# Patient Record
Sex: Female | Born: 1945 | Race: White | Hispanic: No | Marital: Married | Smoking: Never smoker
Health system: Southern US, Community
[De-identification: ages and names within clinical notes are randomized; demographics above are authoritative.]

## PROBLEM LIST (undated history)

## (undated) DIAGNOSIS — E119 Type 2 diabetes mellitus without complications: Secondary | ICD-10-CM

## (undated) DIAGNOSIS — C801 Malignant (primary) neoplasm, unspecified: Secondary | ICD-10-CM

## (undated) DIAGNOSIS — I2510 Atherosclerotic heart disease of native coronary artery without angina pectoris: Secondary | ICD-10-CM

## (undated) DIAGNOSIS — Z9889 Other specified postprocedural states: Secondary | ICD-10-CM

## (undated) DIAGNOSIS — M1990 Unspecified osteoarthritis, unspecified site: Secondary | ICD-10-CM

## (undated) DIAGNOSIS — R112 Nausea with vomiting, unspecified: Secondary | ICD-10-CM

## (undated) DIAGNOSIS — Z951 Presence of aortocoronary bypass graft: Secondary | ICD-10-CM

## (undated) DIAGNOSIS — K909 Intestinal malabsorption, unspecified: Secondary | ICD-10-CM

## (undated) DIAGNOSIS — Z9289 Personal history of other medical treatment: Secondary | ICD-10-CM

## (undated) DIAGNOSIS — K219 Gastro-esophageal reflux disease without esophagitis: Secondary | ICD-10-CM

## (undated) DIAGNOSIS — I10 Essential (primary) hypertension: Secondary | ICD-10-CM

## (undated) DIAGNOSIS — D500 Iron deficiency anemia secondary to blood loss (chronic): Secondary | ICD-10-CM

## (undated) DIAGNOSIS — R011 Cardiac murmur, unspecified: Secondary | ICD-10-CM

## (undated) DIAGNOSIS — Z87442 Personal history of urinary calculi: Secondary | ICD-10-CM

## (undated) DIAGNOSIS — E785 Hyperlipidemia, unspecified: Secondary | ICD-10-CM

## (undated) HISTORY — PX: COLONOSCOPY: SHX174

## (undated) HISTORY — DX: Presence of aortocoronary bypass graft: Z95.1

## (undated) HISTORY — PX: BACK SURGERY: SHX140

## (undated) HISTORY — PX: CHOLECYSTECTOMY: SHX55

## (undated) HISTORY — DX: Intestinal malabsorption, unspecified: K90.9

## (undated) HISTORY — PX: ABDOMINAL HYSTERECTOMY: SHX81

## (undated) HISTORY — DX: Type 2 diabetes mellitus without complications: E11.9

## (undated) HISTORY — DX: Essential (primary) hypertension: I10

## (undated) HISTORY — PX: APPENDECTOMY: SHX54

## (undated) HISTORY — DX: Iron deficiency anemia secondary to blood loss (chronic): D50.0

## (undated) HISTORY — DX: Other specified postprocedural states: Z98.890

## (undated) HISTORY — DX: Hyperlipidemia, unspecified: E78.5

---

## 2015-12-01 LAB — HM MAMMOGRAPHY

## 2016-01-13 ENCOUNTER — Encounter: Payer: Self-pay | Admitting: Physician Assistant

## 2016-01-13 ENCOUNTER — Ambulatory Visit (INDEPENDENT_AMBULATORY_CARE_PROVIDER_SITE_OTHER): Payer: Medicare Other | Admitting: Physician Assistant

## 2016-01-13 VITALS — BP 174/63 | HR 79 | Ht 64.0 in | Wt 150.0 lb

## 2016-01-13 DIAGNOSIS — E118 Type 2 diabetes mellitus with unspecified complications: Secondary | ICD-10-CM | POA: Diagnosis not present

## 2016-01-13 DIAGNOSIS — R252 Cramp and spasm: Secondary | ICD-10-CM | POA: Diagnosis not present

## 2016-01-13 DIAGNOSIS — Z794 Long term (current) use of insulin: Secondary | ICD-10-CM

## 2016-01-13 DIAGNOSIS — E785 Hyperlipidemia, unspecified: Secondary | ICD-10-CM | POA: Diagnosis not present

## 2016-01-13 DIAGNOSIS — M158 Other polyosteoarthritis: Secondary | ICD-10-CM | POA: Diagnosis not present

## 2016-01-13 LAB — POCT GLYCOSYLATED HEMOGLOBIN (HGB A1C): Hemoglobin A1C: 12.9

## 2016-01-13 MED ORDER — CANAGLIFLOZIN 300 MG PO TABS: 300.0000 mg | ORAL_TABLET | Freq: Every day | ORAL | Status: DC

## 2016-01-13 MED ORDER — CELECOXIB 200 MG PO CAPS: 200.0000 mg | ORAL_CAPSULE | Freq: Two times a day (BID) | ORAL | Status: DC

## 2016-01-13 NOTE — Patient Instructions (Addendum)
Alka seltzer tab with water for muscle cramps.  Tumeric 500mg  twice a day.

## 2016-01-14 DIAGNOSIS — E118 Type 2 diabetes mellitus with unspecified complications: Secondary | ICD-10-CM | POA: Insufficient documentation

## 2016-01-14 DIAGNOSIS — R252 Cramp and spasm: Secondary | ICD-10-CM | POA: Insufficient documentation

## 2016-01-14 DIAGNOSIS — Z794 Long term (current) use of insulin: Secondary | ICD-10-CM | POA: Insufficient documentation

## 2016-01-14 DIAGNOSIS — E782 Mixed hyperlipidemia: Secondary | ICD-10-CM | POA: Insufficient documentation

## 2016-01-14 DIAGNOSIS — M158 Other polyosteoarthritis: Secondary | ICD-10-CM | POA: Insufficient documentation

## 2016-01-14 NOTE — Progress Notes (Signed)
   Subjective:    Patient ID: [name], female    DOB: 04-01-1946, 70 y.o.   MRN: [id number]  HPI Pt is a 70 yo female who presents to the clinic to establish care.   .. Active Ambulatory Problems    Diagnosis Date Noted  . Type 2 diabetes mellitus with complication, with long-term current use of insulin ([place]) 01/14/2016  . Hyperlipidemia 01/14/2016  . Muscle cramps 01/14/2016  . Other osteoarthritis involving multiple joints 01/14/2016   Resolved Ambulatory Problems    Diagnosis Date Noted  . No Resolved Ambulatory Problems   Past Medical History  Diagnosis Date  . Hypertension   . Diabetes mellitus without complication ([place])    .Marland Kitchen Family History  Problem Relation Age of Onset  . Heart attack Mother   . Diabetes Mother   . Heart attack Father   . Diabetes Father    .Marland Kitchen Social History   Social History  . Marital Status: Married    Spouse Name: N/A  . Number of Children: N/A  . Years of Education: N/A   Occupational History  . Not on file.   Social History Main Topics  . Smoking status: Never Smoker   . Smokeless tobacco: Not on file  . Alcohol Use: No  . Drug Use: No  . Sexual Activity: No   Other Topics Concern  . Not on file   Social History Narrative  . No narrative on file   Pt has uncontrolled DM. Per patient has type II. She is taking over 300 units of fast acting insulin a day and 400 units of long acting inuslin a day. Her average sugars are 300 and she brings in log. Denies any glycemic events. Denies any open wounds or sores. No overt vision changes or neuropathy.   She does have occasional muscle cramps. Nothing being done about it.   She has multiple joint pain. No known trauma. Worse in hands and knees and back. Not taking anything for it.   Review of Systems    see HPI.  Objective:   Physical Exam  Constitutional: She is oriented to person, place, and time. She appears well-developed and well-nourished.  HENT:  Head: Normocephalic  and atraumatic.  Cardiovascular: Normal rate, regular rhythm and normal heart sounds.   Pulmonary/Chest: Effort normal and breath sounds normal.  Neurological: She is alert and oriented to person, place, and time.  Skin: Skin is dry.  Psychiatric: She has a normal mood and affect. Her behavior is normal.          Assessment & Plan:  Type II diabetes mellitus- .Marland Kitchen Results for orders placed or performed in visit on 01/13/16  POCT HgB A1C  Result Value Ref Range   Hemoglobin A1C 12.9    Unclear what is going on here.  Very high doses of insulin that are not bringing down glucose.  Will consult endocrinology.  Needs eye exam.  Will start invokana and see if will lower glucose.  Concerned about her ability to absorb the insulin.  Follow up potentially sooner but no longer than 3 months.   Muscle cramps- certainly could be due to DM and potassium/sodium pump changes. Will check potassium, sodium, magnesium today.  Discussed alka seltzer at onset of cramps.   OA multiple joints- started celebrex. Discussed working on specific joints. Follow up for this.   Hyperlipidemia- taking lipitor. Will recheck with lipid panel in the future.

## 2016-01-15 ENCOUNTER — Encounter: Payer: Self-pay | Admitting: Physician Assistant

## 2016-01-15 ENCOUNTER — Other Ambulatory Visit: Payer: Self-pay | Admitting: Physician Assistant

## 2016-01-15 DIAGNOSIS — E118 Type 2 diabetes mellitus with unspecified complications: Secondary | ICD-10-CM

## 2016-01-15 DIAGNOSIS — E781 Pure hyperglyceridemia: Secondary | ICD-10-CM | POA: Insufficient documentation

## 2016-01-15 DIAGNOSIS — Z794 Long term (current) use of insulin: Secondary | ICD-10-CM

## 2016-01-15 DIAGNOSIS — N183 Chronic kidney disease, stage 3 unspecified: Secondary | ICD-10-CM | POA: Insufficient documentation

## 2016-01-15 DIAGNOSIS — R7309 Other abnormal glucose: Secondary | ICD-10-CM

## 2016-01-15 LAB — LIPID PANEL
CHOL/HDL RATIO: 3.4 ratio (ref <=5.0)
CHOLESTEROL: 156 mg/dL (ref 125–200)
HDL: 46 mg/dL (ref >=46)
LDL Cholesterol: 38 mg/dL (ref <130)
TRIGLYCERIDES: 360 mg/dL — AB (ref <150)
VLDL: 72 mg/dL — ABNORMAL HIGH (ref <30)

## 2016-01-15 LAB — MAGNESIUM: Magnesium: 1.7 mg/dL (ref 1.5–2.5)

## 2016-01-15 LAB — COMPLETE METABOLIC PANEL WITH GFR
ALBUMIN: 4.2 g/dL (ref 3.6–5.1)
ALT: 15 U/L (ref 6–29)
AST: 15 U/L (ref 10–35)
Alkaline Phosphatase: 124 U/L (ref 33–130)
BILIRUBIN TOTAL: 0.4 mg/dL (ref 0.2–1.2)
BUN: 28 mg/dL — ABNORMAL HIGH (ref 7–25)
CALCIUM: 9.4 mg/dL (ref 8.6–10.4)
CO2: 21 mmol/L (ref 20–31)
Chloride: 101 mmol/L (ref 98–110)
Creat: 1.31 mg/dL — ABNORMAL HIGH (ref 0.60–0.93)
GFR, EST AFRICAN AMERICAN: 47 mL/min — AB (ref >=60)
GFR, EST NON AFRICAN AMERICAN: 41 mL/min — AB (ref >=60)
Glucose, Bld: 275 mg/dL — ABNORMAL HIGH (ref 65–99)
Potassium: 4.9 mmol/L (ref 3.5–5.3)
Sodium: 135 mmol/L (ref 135–146)
Total Protein: 6.7 g/dL (ref 6.1–8.1)

## 2016-01-15 LAB — LIPASE: LIPASE: 44 U/L (ref 7–60)

## 2016-01-15 NOTE — Progress Notes (Signed)
Please call patient. I would like for her to see dr. Hartford Poli here in [place]. He is very smart. I consulted with a doctor in office and felt this was the best option.

## 2016-01-15 NOTE — Addendum Note (Signed)
Addended by: Donella Stade on: 01/15/2016 01:45 PM   Modules accepted: Orders

## 2016-01-16 NOTE — Progress Notes (Signed)
Pt.notified

## 2016-01-19 ENCOUNTER — Other Ambulatory Visit: Payer: Self-pay | Admitting: *Deleted

## 2016-01-19 ENCOUNTER — Telehealth: Payer: Self-pay | Admitting: *Deleted

## 2016-01-19 DIAGNOSIS — Z794 Long term (current) use of insulin: Secondary | ICD-10-CM

## 2016-01-19 DIAGNOSIS — R932 Abnormal findings on diagnostic imaging of liver and biliary tract: Secondary | ICD-10-CM

## 2016-01-19 DIAGNOSIS — E118 Type 2 diabetes mellitus with unspecified complications: Secondary | ICD-10-CM

## 2016-01-19 MED ORDER — CANAGLIFLOZIN 100 MG PO TABS: 100.0000 mg | ORAL_TABLET | Freq: Every day | ORAL | Status: DC

## 2016-01-19 MED ORDER — ICOSAPENT ETHYL 1 G PO CAPS: 2.0000 g | ORAL_CAPSULE | Freq: Two times a day (BID) | ORAL | Status: DC

## 2016-01-19 NOTE — Telephone Encounter (Signed)
CT reordered to WITH IV contrast & sent to [facility].

## 2016-01-20 ENCOUNTER — Other Ambulatory Visit: Payer: Medicare Other

## 2016-01-22 ENCOUNTER — Telehealth: Payer: Self-pay | Admitting: *Deleted

## 2016-01-22 ENCOUNTER — Ambulatory Visit (HOSPITAL_BASED_OUTPATIENT_CLINIC_OR_DEPARTMENT_OTHER)
Admission: RE | Admit: 2016-01-22 | Discharge: 2016-01-22 | Disposition: A | Discharge status: Home / Self Care | Payer: Medicare Other | Source: Ambulatory Visit | Attending: Physician Assistant | Admitting: Physician Assistant

## 2016-01-22 DIAGNOSIS — E118 Type 2 diabetes mellitus with unspecified complications: Secondary | ICD-10-CM | POA: Diagnosis present

## 2016-01-22 DIAGNOSIS — R1030 Lower abdominal pain, unspecified: Secondary | ICD-10-CM | POA: Diagnosis not present

## 2016-01-22 DIAGNOSIS — Z794 Long term (current) use of insulin: Secondary | ICD-10-CM | POA: Insufficient documentation

## 2016-01-22 DIAGNOSIS — I700 Atherosclerosis of aorta: Secondary | ICD-10-CM | POA: Diagnosis not present

## 2016-01-22 MED ORDER — IOPAMIDOL (ISOVUE-300) INJECTION 61%
100.0000 mL | Freq: Once | INTRAVENOUS | Status: AC | PRN
  Administered 2016-01-22: 80 mL via INTRAVENOUS

## 2016-01-22 NOTE — Telephone Encounter (Signed)
Pt left vm stating that she had her CT done today. She was advised not to take her metformin for 2 days.  Is this correct? Please advise.

## 2016-01-22 NOTE — Telephone Encounter (Signed)
PA initiated for Vascepa Key: [facility] - Rx #: [id number]

## 2016-01-22 NOTE — Telephone Encounter (Signed)
Yes due to constrast and metabolized through kidneys.

## 2016-01-23 NOTE — Telephone Encounter (Signed)
Pt.notified

## 2016-01-26 NOTE — Telephone Encounter (Signed)
Vascepa denied since triglyceride left is less than 500mg /dl. Denial letter placed in provider's box

## 2016-01-27 ENCOUNTER — Telehealth: Payer: Self-pay

## 2016-01-27 NOTE — Addendum Note (Signed)
Addended by: Donella Stade on: 01/27/2016 09:46 AM   Modules accepted: Orders

## 2016-01-27 NOTE — Telephone Encounter (Signed)
Pt notified of the results of abdominal CT.  She had received a call from [facility] and called to find out what it showed.

## 2016-01-27 NOTE — Telephone Encounter (Signed)
Will refer to GI.

## 2016-02-13 ENCOUNTER — Encounter: Payer: Self-pay | Admitting: Physician Assistant

## 2016-02-13 ENCOUNTER — Telehealth: Payer: Self-pay | Admitting: Physician Assistant

## 2016-02-13 ENCOUNTER — Ambulatory Visit (INDEPENDENT_AMBULATORY_CARE_PROVIDER_SITE_OTHER): Payer: Medicare Other | Admitting: Physician Assistant

## 2016-02-13 VITALS — BP 148/58 | HR 67 | Ht 64.0 in | Wt 149.0 lb

## 2016-02-13 DIAGNOSIS — E118 Type 2 diabetes mellitus with unspecified complications: Secondary | ICD-10-CM | POA: Diagnosis not present

## 2016-02-13 DIAGNOSIS — K769 Liver disease, unspecified: Secondary | ICD-10-CM | POA: Diagnosis not present

## 2016-02-13 DIAGNOSIS — N183 Chronic kidney disease, stage 3 unspecified: Secondary | ICD-10-CM

## 2016-02-13 DIAGNOSIS — M158 Other polyosteoarthritis: Secondary | ICD-10-CM

## 2016-02-13 DIAGNOSIS — Z794 Long term (current) use of insulin: Secondary | ICD-10-CM

## 2016-02-13 MED ORDER — AMLODIPINE BESYLATE 10 MG PO TABS: 10.0000 mg | ORAL_TABLET | Freq: Every day | ORAL | Status: DC

## 2016-02-13 NOTE — Telephone Encounter (Signed)
Please call [facility] [place] for copy of mammogram.

## 2016-02-13 NOTE — Progress Notes (Signed)
   Subjective:    Patient ID: [name], female    DOB: 07-Jan-1946, 70 y.o.   MRN: [id number]  HPI  Pt is a 70 yo female with uncontrolled type II diabetes who comes in for follow up .   Dr. Hartford Poli, endocrinologist, is following DM uncontrolled. Changed up meds and patient is to follow up later this month. She does not notice much lower sugars but continues to use rapid acting insulin and take metformin and actos.   She is also being evaluated by GI for chronic liver disease.   She has osteoarthritis and doing great on Celebrex daily. Since taking it she has felt great and had little to none joint pain.    Review of Systems  All other systems reviewed and are negative.      Objective:   Physical Exam  Constitutional: She is oriented to person, place, and time. She appears well-developed and well-nourished.  HENT:  Head: Normocephalic and atraumatic.  Cardiovascular: Normal rate, regular rhythm and normal heart sounds.   Pulmonary/Chest: Effort normal and breath sounds normal.  Neurological: She is alert and oriented to person, place, and time.  Psychiatric: She has a normal mood and affect. Her behavior is normal.          Assessment & Plan:  Osteoarthritis/CKD III- continue on celebrex for now. Drop down to once a day. Will recheck bmp to check kidney function. Discussed if increasing kidney function going to have to stop celebrex even though it is helping so much.   DM, uncontrolled type II- managed by Dr. Hartford Poli. Will find out which pneumonia vaccine she is missing.  HTN- need to increase norvasc to 10mg .    Chronic liver disease- managed by [facility]. Liver enzymes great. Screening labs found no cause for liver disease other than DM.   Follow up in 3 months.   Will call to get mammogram from the breast clinic in [place].

## 2016-02-13 NOTE — Telephone Encounter (Signed)
A request was faxed.

## 2016-02-27 LAB — BASIC METABOLIC PANEL WITH GFR
BUN: 19 mg/dL (ref 7–25)
CALCIUM: 9.5 mg/dL (ref 8.6–10.4)
CO2: 23 mmol/L (ref 20–31)
Chloride: 100 mmol/L (ref 98–110)
Creat: 1.12 mg/dL — ABNORMAL HIGH (ref 0.60–0.93)
GFR, EST AFRICAN AMERICAN: 57 mL/min — AB (ref >=60)
GFR, EST NON AFRICAN AMERICAN: 50 mL/min — AB (ref >=60)
GLUCOSE: 368 mg/dL — AB (ref 65–99)
POTASSIUM: 4.9 mmol/L (ref 3.5–5.3)
Sodium: 135 mmol/L (ref 135–146)

## 2016-03-04 LAB — HM DIABETES EYE EXAM

## 2016-03-18 ENCOUNTER — Encounter: Payer: Self-pay | Admitting: Physician Assistant

## 2016-03-29 ENCOUNTER — Other Ambulatory Visit: Payer: Self-pay | Admitting: Physician Assistant

## 2016-05-18 ENCOUNTER — Ambulatory Visit (INDEPENDENT_AMBULATORY_CARE_PROVIDER_SITE_OTHER): Payer: Medicare Other | Admitting: Physician Assistant

## 2016-05-18 ENCOUNTER — Encounter: Payer: Self-pay | Admitting: Physician Assistant

## 2016-05-18 VITALS — BP 121/60 | HR 64 | Ht 64.0 in | Wt 147.0 lb

## 2016-05-18 DIAGNOSIS — K5900 Constipation, unspecified: Secondary | ICD-10-CM | POA: Insufficient documentation

## 2016-05-18 DIAGNOSIS — Z79899 Other long term (current) drug therapy: Secondary | ICD-10-CM | POA: Diagnosis not present

## 2016-05-18 DIAGNOSIS — N183 Chronic kidney disease, stage 3 unspecified: Secondary | ICD-10-CM

## 2016-05-18 DIAGNOSIS — Z794 Long term (current) use of insulin: Secondary | ICD-10-CM

## 2016-05-18 DIAGNOSIS — M158 Other polyosteoarthritis: Secondary | ICD-10-CM

## 2016-05-18 DIAGNOSIS — E118 Type 2 diabetes mellitus with unspecified complications: Secondary | ICD-10-CM

## 2016-05-18 LAB — BASIC METABOLIC PANEL WITH GFR
BUN: 23 mg/dL (ref 7–25)
CO2: 21 mmol/L (ref 20–31)
CREATININE: 1.12 mg/dL — AB (ref 0.60–0.93)
Calcium: 9.5 mg/dL (ref 8.6–10.4)
Chloride: 108 mmol/L (ref 98–110)
GFR, EST AFRICAN AMERICAN: 57 mL/min — AB (ref >=60)
GFR, Est Non African American: 50 mL/min — ABNORMAL LOW (ref >=60)
GLUCOSE: 82 mg/dL (ref 65–99)
Potassium: 5.1 mmol/L (ref 3.5–5.3)
Sodium: 140 mmol/L (ref 135–146)

## 2016-05-18 NOTE — Patient Instructions (Signed)

## 2016-05-18 NOTE — Progress Notes (Addendum)
Subjective:     Patient ID: [name], female   DOB: 1945-12-29, 70 y.o.   MRN: [id number]  HPI Patient is a 70 y.o. Caucasian female presenting today for a routine follow-up for her diabetes and osteoarthritis. Patient reports that she is currently being monitored by endocrinology and was recently placed on an insulin pump. The patient states that she is feeling much better following this change to her medical regimen and has seen improvements in her activity level and appetite. The patient notes that she is still "leery" about utilizing the pump but that she is able to regularly contact the nurse at her endocrinologist whom is able to help educate her. The patient states that prior to being place on the pump her blood sugars were reaching 500-600 range. The patient notes that she is currently not eating as much and is unsure of the signs/symptoms of hypoglycemia. The patient notes that change was made to her Metformin but is unsure of the new medications name. Also, the patient notes that she was scheduled for a colonoscopy but was unable to have one due to her increased blood sugars. The patient states that she is also doing great on her celebrex although she does experience some break through pain. Lastly, the patient states that she is experiencing some constipation as she only has bowel movements every 2-3 days. She states that her stool is hard and that she has not taking anything for her symptoms but drinks a large amount of water every day. Patient states that she is trying to stay active but is having some problems due to her family issues.   Review of Systems  Constitutional: Positive for activity change and appetite change. Negative for chills, diaphoresis, fatigue, fever and unexpected weight change.  HENT: Negative for congestion, ear discharge, ear pain, facial swelling, postnasal drip, rhinorrhea, sinus pressure and sore throat.   Respiratory: Negative for apnea, cough, chest tightness,  shortness of breath and wheezing.   Cardiovascular: Negative for chest pain, palpitations and leg swelling.  Gastrointestinal: Positive for constipation. Negative for abdominal distention, abdominal pain, anal bleeding, blood in stool, diarrhea, nausea, rectal pain and vomiting.  Endocrine: Negative for cold intolerance, heat intolerance, polydipsia, polyphagia and polyuria.  Genitourinary: Negative for decreased urine volume, difficulty urinating, dysuria, enuresis, flank pain, frequency, hematuria, pelvic pain, urgency, vaginal bleeding, vaginal discharge and vaginal pain.  Musculoskeletal: Positive for arthralgias. Negative for back pain, myalgias, neck pain and neck stiffness.  Neurological: Negative for dizziness, tremors, syncope, weakness, light-headedness, numbness and headaches.  Psychiatric/Behavioral: Negative for confusion.      Objective:   Physical Exam  Constitutional: She is oriented to person, place, and time. She appears well-developed and well-nourished. No distress.  HENT:  Head: Normocephalic and atraumatic.  Nose: Nose normal.  Mouth/Throat: Oropharynx is clear and moist. No oropharyngeal exudate.  Eyes: Conjunctivae and EOM are normal. Pupils are equal, round, and reactive to light. Right eye exhibits no discharge. Left eye exhibits no discharge. No scleral icterus.  Neck: Normal range of motion. Neck supple. No JVD present. No tracheal deviation present. No thyromegaly present.  Cardiovascular: Normal rate, regular rhythm and intact distal pulses.  Exam reveals no gallop and no friction rub.   Murmur heard. Pulmonary/Chest: Effort normal and breath sounds normal. No stridor. No respiratory distress. She has no wheezes. She has no rales. She exhibits no tenderness.  Abdominal: Soft. Bowel sounds are normal. She exhibits no distension and no mass. There is no tenderness. There is no  rebound and no guarding.  Lymphadenopathy:    She has no cervical adenopathy.   Neurological: She is alert and oriented to person, place, and time. She has normal reflexes. She displays normal reflexes. No cranial nerve deficit. She exhibits normal muscle tone. Coordination normal.  Skin: Skin is warm and dry. No rash noted. She is not diaphoretic. No erythema. No pallor.  Psychiatric: She has a normal mood and affect. Her behavior is normal. Judgment and thought content normal.      Assessment:      Diagnoses and all orders for this visit:  CKD (chronic kidney disease), stage III  Medication management -     BASIC METABOLIC PANEL WITH GFR  Other osteoarthritis involving multiple joints  Constipation, unspecified constipation type  Type 2 diabetes mellitus with complication, with long-term current use of insulin ([place])      Plan:     1. Osteoarthritis/medication adjustment/CKD - Patient reports that she is currently well managed on her medication regimen of celebrex 200mg  twice daily. Patient was informed that secondary to her CKD she will need labwork to evaluate her kidney function prior to receive a refill of her celebrex. Will contact patient with laboratory results and possible medication refill.   2. Constipation - Patient was instructed to increase fluid intake and dietary fiber consumption such as lentils, black or lima beans, peas, and oatmeal. Discussed with patient to initiate over-the-counter stool softeners such as Miralax, Cirucel, or Metamucil. Instructed patient to address her concerns with her gastroenterologist prior to her colonoscopy. Patient to return-to-clinic if symptoms persist or worsen.  3. Diabetes mellitus type 2 - Patient to continue current medical management with endocrinologist, Dr. Francetta Found. Patient reports that she is doing well on her medication regimen at this time.    Summary - Patient to follow-up in 6 months for management of her chronic medical conditions. Patient to return sooner if acute symptoms persist or worsen.  Will contact patient for laboratory review.

## 2016-05-19 ENCOUNTER — Other Ambulatory Visit: Payer: Self-pay | Admitting: *Deleted

## 2016-05-19 MED ORDER — CELECOXIB 200 MG PO CAPS: 200.0000 mg | ORAL_CAPSULE | Freq: Two times a day (BID) | ORAL | 5 refills | Status: DC

## 2016-05-20 ENCOUNTER — Ambulatory Visit (INDEPENDENT_AMBULATORY_CARE_PROVIDER_SITE_OTHER): Payer: Medicare Other | Admitting: Family Medicine

## 2016-05-20 VITALS — BP 162/72 | HR 71

## 2016-05-20 DIAGNOSIS — Z23 Encounter for immunization: Secondary | ICD-10-CM

## 2016-05-20 NOTE — Progress Notes (Signed)
   Subjective:    Patient ID: [name], female    DOB: Jul 06, 1946, 70 y.o.   MRN: [id number]  HPI    Review of Systems     Objective:   Physical Exam        Assessment & Plan:  Agree with above. Beatrice Lecher, MD

## 2016-05-20 NOTE — Progress Notes (Signed)
Pt in for Pneumovax23 injection.  Given LD with no complications.

## 2016-07-01 ENCOUNTER — Other Ambulatory Visit: Payer: Self-pay | Admitting: Physician Assistant

## 2016-07-01 NOTE — Telephone Encounter (Signed)
Okay to fill? You've never written this for her & she's on amlodipine as well.

## 2016-08-10 ENCOUNTER — Other Ambulatory Visit: Payer: Self-pay | Admitting: Physician Assistant

## 2016-08-27 ENCOUNTER — Encounter: Payer: Self-pay | Admitting: Physician Assistant

## 2016-08-27 DIAGNOSIS — K259 Gastric ulcer, unspecified as acute or chronic, without hemorrhage or perforation: Secondary | ICD-10-CM | POA: Insufficient documentation

## 2016-09-15 ENCOUNTER — Ambulatory Visit (INDEPENDENT_AMBULATORY_CARE_PROVIDER_SITE_OTHER): Payer: Medicare Other | Admitting: Physician Assistant

## 2016-09-15 ENCOUNTER — Encounter: Payer: Self-pay | Admitting: Physician Assistant

## 2016-09-15 VITALS — BP 149/75 | HR 86 | Wt 146.5 lb

## 2016-09-15 DIAGNOSIS — C182 Malignant neoplasm of ascending colon: Secondary | ICD-10-CM | POA: Diagnosis not present

## 2016-09-15 DIAGNOSIS — R9431 Abnormal electrocardiogram [ECG] [EKG]: Secondary | ICD-10-CM

## 2016-09-15 DIAGNOSIS — K219 Gastro-esophageal reflux disease without esophagitis: Secondary | ICD-10-CM

## 2016-09-15 DIAGNOSIS — Z01818 Encounter for other preprocedural examination: Secondary | ICD-10-CM

## 2016-09-15 NOTE — Progress Notes (Signed)
   Subjective:    Patient ID: [name], female    DOB: 21-May-1946, 71 y.o.   MRN: [id number]  HPI  Pt is a 71 yo female who presents to the clinic with malignant neoplasm of ascending colon who needs pre surgical clearance.   71yo F with colon cancer -Based on the colonoscopy report from Dr. Shary Key, she has cancer in her cecum and will require a right hemicolectomy. I discussed with her the finding on the CT scan of rectal thickening, which I do no think represents a second malignancy. I will contact Dr. Shary Key to discuss her colonoscopy with him, as I was unable to adequately visualize the rectum in clinic today. If there is any question about the findings, then will perform a colonoscopy the day before surgery. -Discussed my recommendation of robotic-assisted right hemicolectomy. I reviewed the anatomy and discussed risks of bleeding, scar, pain, infection, damage to adjacent structures, MI, pneumonia, DVT, stroke. Also reviewed anastomotic leak and emergency indications that would lead to stoma creation. Discussed with her prior open operations the potential conversion to an open procedure. -I will contact her endocrinologist, Dr. Hartford Poli, about any recommendations for managing her diabetes with the pump off during her surgery and immediate post-op course.   CT scan results:  MPRESSION: 1. Possible low rectal wall thickening, potentially representing the known primary tumor. 2. No evidence of metastatic disease in the chest, abdomen, or pelvis. 3. Cirrhotic liver.  Pt does not have a scheduled date for surgery.   Review of Systems  All other systems reviewed and are negative.      Objective:   Physical Exam  Constitutional: She is oriented to person, place, and time. She appears well-developed and well-nourished.  HENT:  Head: Normocephalic and atraumatic.  Right Ear: External ear normal.  Left Ear: External ear normal.  Nose: Nose normal.  Mouth/Throat: Oropharynx is clear and  moist. No oropharyngeal exudate.  Eyes: Conjunctivae are normal. Right eye exhibits no discharge. Left eye exhibits no discharge.  Neck: Normal range of motion. Neck supple.  Cardiovascular: Normal rate, regular rhythm and normal heart sounds.   Pulmonary/Chest: Effort normal and breath sounds normal. She has no wheezes.  Lymphadenopathy:    She has no cervical adenopathy.  Neurological: She is alert and oriented to person, place, and time.  Psychiatric: She has a normal mood and affect. Her behavior is normal.          Assessment & Plan:  Marland KitchenMarland KitchenIvy was seen today for surgical clearance.  Diagnoses and all orders for this visit:  Preoperative clearance -     COMPLETE METABOLIC PANEL WITH GFR -     CBC with Differential/Platelet  Malignant neoplasm of ascending colon (HCC)  Gastroesophageal reflux disease without esophagitis   EKG-NSR at 80. Low voltage QRS and inverted t waves leads 1, 2, 3 and aVR, aVL, aVF. Will send to cardiology for further clearance. Pt denies any CP, palpitations, headache, dizziness.  Labs ordered to check kidney function and hgb.  Will send letter for clearance once we get all labs.

## 2016-09-15 NOTE — Addendum Note (Signed)
Addended by: Huel Cote on: 09/15/2016 02:20 PM   Modules accepted: Orders

## 2016-09-16 ENCOUNTER — Telehealth: Payer: Self-pay | Admitting: *Deleted

## 2016-09-16 ENCOUNTER — Encounter: Payer: Self-pay | Admitting: Physician Assistant

## 2016-09-16 DIAGNOSIS — D649 Anemia, unspecified: Secondary | ICD-10-CM

## 2016-09-16 LAB — CBC WITH DIFFERENTIAL/PLATELET
BASOS ABS: 0 {cells}/uL (ref 0–200)
Basophils Relative: 0 %
EOS PCT: 4 %
Eosinophils Absolute: 220 cells/uL (ref 15–500)
HCT: 22.1 % — ABNORMAL LOW (ref 35.0–45.0)
Hemoglobin: 6.3 g/dL — ABNORMAL LOW (ref 11.7–15.5)
Lymphocytes Relative: 16 %
Lymphs Abs: 880 cells/uL (ref 850–3900)
MCH: 20.4 pg — AB (ref 27.0–33.0)
MCHC: 28.5 g/dL — ABNORMAL LOW (ref 32.0–36.0)
MCV: 71.5 fL — ABNORMAL LOW (ref 80.0–100.0)
MONOS PCT: 7 %
MPV: 10.1 fL (ref 7.5–12.5)
Monocytes Absolute: 385 cells/uL (ref 200–950)
NEUTROS ABS: 4015 {cells}/uL (ref 1500–7800)
NEUTROS PCT: 73 %
PLATELETS: 218 10*3/uL (ref 140–400)
RBC: 3.09 MIL/uL — ABNORMAL LOW (ref 3.80–5.10)
RDW: 16.4 % — ABNORMAL HIGH (ref 11.0–15.0)
WBC: 5.5 10*3/uL (ref 3.8–10.8)

## 2016-09-16 LAB — COMPLETE METABOLIC PANEL WITH GFR
ALBUMIN: 3.8 g/dL (ref 3.6–5.1)
ALK PHOS: 135 U/L — AB (ref 33–130)
ALT: 31 U/L — AB (ref 6–29)
AST: 25 U/L (ref 10–35)
BILIRUBIN TOTAL: 0.2 mg/dL (ref 0.2–1.2)
BUN: 24 mg/dL (ref 7–25)
CO2: 20 mmol/L (ref 20–31)
CREATININE: 1.4 mg/dL — AB (ref 0.60–0.93)
Calcium: 9.3 mg/dL (ref 8.6–10.4)
Chloride: 107 mmol/L (ref 98–110)
GFR, Est African American: 44 mL/min — ABNORMAL LOW (ref >=60)
GFR, Est Non African American: 38 mL/min — ABNORMAL LOW (ref >=60)
GLUCOSE: 197 mg/dL — AB (ref 65–99)
Potassium: 5.2 mmol/L (ref 3.5–5.3)
SODIUM: 137 mmol/L (ref 135–146)
TOTAL PROTEIN: 6.5 g/dL (ref 6.1–8.1)

## 2016-09-16 NOTE — Telephone Encounter (Signed)
Hematology referral placed for extremely low hemoglobin.

## 2016-09-17 ENCOUNTER — Ambulatory Visit (HOSPITAL_COMMUNITY)
Admission: RE | Admit: 2016-09-17 | Discharge: 2016-09-17 | Disposition: A | Discharge status: Home / Self Care | Payer: Medicare Other | Source: Ambulatory Visit | Attending: Hematology & Oncology | Admitting: Hematology & Oncology

## 2016-09-17 ENCOUNTER — Encounter: Payer: Self-pay | Admitting: Hematology & Oncology

## 2016-09-17 ENCOUNTER — Ambulatory Visit (HOSPITAL_BASED_OUTPATIENT_CLINIC_OR_DEPARTMENT_OTHER): Payer: Medicare Other | Admitting: Hematology & Oncology

## 2016-09-17 ENCOUNTER — Ambulatory Visit (HOSPITAL_BASED_OUTPATIENT_CLINIC_OR_DEPARTMENT_OTHER): Payer: Medicare Other

## 2016-09-17 ENCOUNTER — Other Ambulatory Visit (HOSPITAL_BASED_OUTPATIENT_CLINIC_OR_DEPARTMENT_OTHER): Payer: Medicare Other

## 2016-09-17 ENCOUNTER — Ambulatory Visit: Payer: Medicare Other

## 2016-09-17 VITALS — BP 118/43 | HR 77 | Temp 98.0°F | Resp 17

## 2016-09-17 DIAGNOSIS — K909 Intestinal malabsorption, unspecified: Secondary | ICD-10-CM

## 2016-09-17 DIAGNOSIS — C180 Malignant neoplasm of cecum: Secondary | ICD-10-CM | POA: Diagnosis not present

## 2016-09-17 DIAGNOSIS — D649 Anemia, unspecified: Secondary | ICD-10-CM

## 2016-09-17 DIAGNOSIS — C182 Malignant neoplasm of ascending colon: Secondary | ICD-10-CM

## 2016-09-17 DIAGNOSIS — D500 Iron deficiency anemia secondary to blood loss (chronic): Secondary | ICD-10-CM | POA: Diagnosis not present

## 2016-09-17 DIAGNOSIS — D6489 Other specified anemias: Secondary | ICD-10-CM | POA: Insufficient documentation

## 2016-09-17 HISTORY — DX: Iron deficiency anemia secondary to blood loss (chronic): D50.0

## 2016-09-17 HISTORY — DX: Intestinal malabsorption, unspecified: K90.9

## 2016-09-17 LAB — CBC WITH DIFFERENTIAL (CANCER CENTER ONLY)
BASO#: 0 10*3/uL (ref 0.0–0.2)
BASO%: 0.5 % (ref 0.0–2.0)
EOS ABS: 0.4 10*3/uL (ref 0.0–0.5)
EOS%: 9.2 % — AB (ref 0.0–7.0)
HEMATOCRIT: 22.2 % — AB (ref 34.8–46.6)
HGB: 6.3 g/dL — CL (ref 11.6–15.9)
LYMPH#: 0.9 10*3/uL (ref 0.9–3.3)
LYMPH%: 20.7 % (ref 14.0–48.0)
MCH: 20.7 pg — AB (ref 26.0–34.0)
MCHC: 28.4 g/dL — AB (ref 32.0–36.0)
MCV: 73 fL — AB (ref 81–101)
MONO#: 0.4 10*3/uL (ref 0.1–0.9)
MONO%: 9.4 % (ref 0.0–13.0)
NEUT#: 2.6 10*3/uL (ref 1.5–6.5)
NEUT%: 60.2 % (ref 39.6–80.0)
Platelets: 209 10*3/uL (ref 145–400)
RBC: 3.04 10*6/uL — ABNORMAL LOW (ref 3.70–5.32)
RDW: 16.2 % — AB (ref 11.1–15.7)
WBC: 4.4 10*3/uL (ref 3.9–10.0)

## 2016-09-17 LAB — CMP (CANCER CENTER ONLY)
ALT(SGPT): 31 U/L (ref 10–47)
AST: 21 U/L (ref 11–38)
Albumin: 3.3 g/dL (ref 3.3–5.5)
Alkaline Phosphatase: 139 U/L — ABNORMAL HIGH (ref 26–84)
BUN, Bld: 27 mg/dL — ABNORMAL HIGH (ref 7–22)
CALCIUM: 9.3 mg/dL (ref 8.0–10.3)
CO2: 22 meq/L (ref 18–33)
Chloride: 107 mEq/L (ref 98–108)
Creat: 1.4 mg/dl — ABNORMAL HIGH (ref 0.6–1.2)
GLUCOSE: 176 mg/dL — AB (ref 73–118)
POTASSIUM: 4.6 meq/L (ref 3.3–4.7)
Sodium: 138 mEq/L (ref 128–145)
Total Bilirubin: 0.5 mg/dl (ref 0.20–1.60)
Total Protein: 6.6 g/dL (ref 6.4–8.1)

## 2016-09-17 LAB — PREPARE RBC (CROSSMATCH)

## 2016-09-17 LAB — IRON AND TIBC
%SAT: 3 % — AB (ref 21–57)
IRON: 12 ug/dL — AB (ref 41–142)
TIBC: 454 ug/dL — AB (ref 236–444)
UIBC: 442 ug/dL — AB (ref 120–384)

## 2016-09-17 LAB — ABO/RH: ABO/RH(D): O POS

## 2016-09-17 LAB — FERRITIN

## 2016-09-17 MED ORDER — SODIUM CHLORIDE 0.9 % IV SOLN
Freq: Once | INTRAVENOUS | Status: AC
  Administered 2016-09-17: 13:00:00 via INTRAVENOUS

## 2016-09-17 MED ORDER — SODIUM CHLORIDE 0.9 % IV SOLN
510.0000 mg | Freq: Once | INTRAVENOUS | Status: AC
  Administered 2016-09-17: 510 mg via INTRAVENOUS
  Filled 2016-09-17: qty 17

## 2016-09-17 NOTE — Addendum Note (Signed)
Addended by: Burney Gauze R on: 09/17/2016 09:20 AM   Modules accepted: Orders

## 2016-09-17 NOTE — Progress Notes (Signed)
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 New Evaluation  : My blood is very low.  HPI: [name] is a very charming 71 year old white female. She has a 30 year history of diabetes. She is on an insulin pump.  She thinks her last colonoscopy was about 3 years ago.  She apparently was noted to be quite anemic when she saw a new doctor. She was found to have blood in her stool area did  She was referred to gastroenterology. This was in was in [place]. The gastroenterologist, Dr. Shary Key, found a mass in the ascending colon. Unfortunately, I don't have any of those reports. However, for the record sent over, she was noted to have colon cancer.  She is due for surgery February 2. However, her hemoglobin is quite low.  Lab work that I have on her shows that she is quite anemic. On general retentive, her white cell count was 5.5. Hemoglobin 6.3. Platelet count 218,000. MCV was 71. Of course, no iron studies have been done yet.  Of note, she did have a CT of the abdomen done back in May 2017. Nothing was noted. She had some features of cirrhosis. I suspect this probably is a fatty liver from her diabetes.  She has not noted any melena or bright red blood per rectum. There's been no weight loss or weight gain. She's had no abdominal pain.  There is no history of colon cancer in the family. I think there is a history of breast cancer.  She does not smoke. She has had cholecystectomy.  She has 2 children and 5 grandkids.  Overall, her performance status is ECOG 1.    Past Medical History:  Diagnosis Date  . Diabetes mellitus without complication ([place])   . Hyperlipidemia   . Hypertension   . Iron deficiency anemia due to chronic blood loss 09/17/2016  . 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 ABDOMINAL HYSTERECTOMY    . CHOLECYSTECTOMY     :   Current Outpatient Prescriptions:  .  amLODipine (NORVASC) 5 MG tablet, Take one tablet (5 mg total) by mouth daily., Disp: 90 tablet, Rfl: 0 .  atorvastatin (LIPITOR) 20 MG tablet, Take 10 mg by mouth daily., Disp: , Rfl:  .  Calcium Carbonate-Vitamin D (CALTRATE 600+D PO), Take by mouth., Disp: , Rfl:  .  cloNIDine (CATAPRES) 0.2 MG tablet, Take 0.2 mg by mouth 2 (two) times daily., Disp: , Rfl:  .  Icosapent Ethyl 1 g CAPS, Take 2 g by mouth 2 (two) times daily., Disp: 120 capsule, Rfl: 4 .  metFORMIN (GLUCOPHAGE-XR) 500 MG 24 hr tablet, Take two tablets (1,000 mg total) by mouth 2 (two) times daily with meals., Disp: , Rfl: 5 .  omeprazole (PRILOSEC) 40 MG capsule, Take 40 mg by mouth 2 (two) times daily., Disp: , Rfl:  .  valsartan (DIOVAN) 160 MG tablet, Take one tablet (160 mg total) by mouth daily., Disp: 90 tablet, Rfl: 0 .  Vitamin D, Cholecalciferol, 400 units CAPS, Take by mouth., Disp: , Rfl: :  :  Allergies  Allergen Reactions  . Hydrochlorothiazide     Other reaction(s): Other HYPERCALCEMIA  . Losartan Potassium     Other reaction(s): Other Increased eosinophils  :  Family History  Problem Relation Age of Onset  . Heart attack Mother   . Diabetes  Mother   . Heart attack Father   . Diabetes Father   :  Social History   Social History  . Marital status: Married    Spouse name: N/A  . Number of children: N/A  . Years of education: N/A   Occupational History  . Not on file.   Social History Main Topics  . Smoking status: Never Smoker  . Smokeless tobacco: Never Used  . Alcohol use No  . Drug use: No  . Sexual activity: No   Other Topics Concern  . Not on file   Social History Narrative  . No narrative on file  :  Pertinent items are noted in HPI.  Exam: Well-developed well-nourished white female in no obvious distress. Vital signs show temperature of 98. Pulse 82. Blood pressure 109/47. Weight is 144 pounds. Head and neck exam shows no  ocular or oral lesions. She has no scleral icterus. Her conjunctiva are quite pale. There is no adenopathy in the neck. Lungs are clear. Cardiac exam regular rate and rhythm with a 2/6 systolic ejection murmur. Abdomen is soft. She has good bowel sounds. She is mildly obese. She has no guarding or rebound tenderness. There is no fluid wave. There is no palpable liver or spleen tip. Back exam shows no tenderness over the spine, ribs or hips. External he shows no clubbing, cyanosis or edema. Neurological exam shows no focal neurological deficits. Skin exam shows no rashes, ecchymoses or petechia.  Recent Labs  09/15/16 1412 09/17/16 0758  WBC 5.5 4.4  HGB 6.3* 6.3*  HCT 22.1* 22.2*  PLT 218 209    Recent Labs  09/15/16 1412 09/17/16 0758  NA 137 138  K 5.2 4.6  CL 107 107  CO2 20 22  GLUCOSE 197* 176*  BUN 24 27*  CREATININE 1.40* 1.4*  CALCIUM 9.3 9.3    Blood smear review:  Microcytic and hypochromic red blood cells. There is moderate anisocytosis and poikilocytosis. She has no teardrop cells. There are no nucleated red blood cells. There is no inclusion bodies. She has no rouleau formation. White cells are normal in morphology and maturation. There are no hypersegmented polys. There are no immature myeloid or lymphoid forms. Platelets are adequate in number and size.  Pathology: None     Assessment and Plan:  [name] is a 71 year old white female with marked anemia. This is microcytic anemia. Her blood smear is highly consistent with iron deficiency. I know she has the diabetes. 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  With her surgery coming up in 3 weeks, we do not have a lot of time to get her blood up.  I think she will clearly need to be transfused. I will give her 2 units of blood. I'm not sure what might be going on with her heart. I do not want to stress her heart out with anemia.  We will see what her iron studies show. I'm sure she will need at  least 1 dose of iron.  We will follow her erythropoietin level. I suppose that if her erythropoietin level is low, we may want to consider Aranesp.  I spent about 45 minutes with her. This is somewhat complicated. We have a lot to do to get her ready for surgery.  I'm sure that she is bleeding from her colon cancer. She says she has "ulcers". I will have to get the endoscopy report from her doctor.  We'll plan to get her back in 2  weeks so that we see her blood looks before she has surgery.

## 2016-09-17 NOTE — Addendum Note (Signed)
Addended by: Burney Gauze R on: 09/17/2016 01:22 PM   Modules accepted: Orders

## 2016-09-17 NOTE — Patient Instructions (Signed)

## 2016-09-18 LAB — RETICULOCYTES: Reticulocyte Count: 1.5 % (ref 0.6–2.6)

## 2016-09-20 ENCOUNTER — Other Ambulatory Visit: Payer: Self-pay | Admitting: Family

## 2016-09-20 ENCOUNTER — Ambulatory Visit: Payer: Medicare Other

## 2016-09-20 DIAGNOSIS — D500 Iron deficiency anemia secondary to blood loss (chronic): Secondary | ICD-10-CM

## 2016-09-20 DIAGNOSIS — C182 Malignant neoplasm of ascending colon: Secondary | ICD-10-CM

## 2016-09-20 DIAGNOSIS — D6489 Other specified anemias: Secondary | ICD-10-CM | POA: Diagnosis not present

## 2016-09-20 LAB — HEMOGLOBINOPATHY EVALUATION
HEMOGLOBIN F QUANTITATION: 0 % (ref 0.0–2.0)
HGB C: 0 %
HGB S: 0 %
HGB VARIANT: 0 %
Hemoglobin A2 Quantitation: 1.4 % — ABNORMAL LOW (ref 1.8–3.2)
Hgb A: 98.6 % (ref 96.4–98.8)

## 2016-09-20 LAB — ERYTHROPOIETIN: ERYTHROPOIETIN: 405.5 m[IU]/mL — AB (ref 2.6–18.5)

## 2016-09-20 NOTE — Progress Notes (Signed)
Cardiology Office Note   Date:  09/21/2016   ID:  [name], DOB 1946/08/02, MRN [id number]  PCP:  [name], PA-C  Cardiologist:   Jenkins Rouge, MD   Chief Complaint  Patient presents with  . Establish Care      History of Present Illness: [name] is a 71 y.o. female who presents for preop evaluation Has newly diagnosed cecal cancer. Found to be very anemic and f/u discovered CA. 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 HTN and elevated lipids. She will require right hemi colectomy ECG abnormal and sent to cardiology for clearance No history of CAD, vascular disease chest pain dyspne or palpitations.   Last Hct 22.2 09/17/16  She has been transfused and getting another one next week at cancer center Sedentary. Has not had previous stress testing has had a murmur in past also Not looked into.    Surgery schedule first week in February with Dr Rosalio Loud pending cardiac clearance   Past Medical History:  Diagnosis Date  . Diabetes mellitus without complication ([place])   . Hyperlipidemia   . Hypertension   . Iron deficiency anemia due to chronic blood loss 09/17/2016  . 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 ABDOMINAL HYSTERECTOMY    . CHOLECYSTECTOMY       Current Outpatient Prescriptions  Medication Sig Dispense Refill  . amLODipine (NORVASC) 5 MG tablet Take one tablet (5 mg total) by mouth daily. 90 tablet 0  . atorvastatin (LIPITOR) 20 MG tablet Take 10 mg by mouth daily.    . Calcium Carbonate-Vitamin D (CALTRATE 600+D PO) Take by mouth.    . cloNIDine (CATAPRES) 0.2 MG tablet Take 0.2 mg by mouth 2 (two) times daily.    Vanessa Kick Ethyl 1 g CAPS Take 2 g by mouth 2 (two) times daily. 120 capsule 4  . metFORMIN (GLUCOPHAGE-XR) 500 MG 24 hr tablet Take two tablets (1,000 mg total) by mouth 2 (two) times daily with meals.  5  . omeprazole (PRILOSEC) 40 MG capsule Take 40 mg by mouth 2 (two)  times daily.    . valsartan (DIOVAN) 160 MG tablet Take one tablet (160 mg total) by mouth daily. 90 tablet 0  . Vitamin D, Cholecalciferol, 400 units CAPS Take by mouth.     No current facility-administered medications for this visit.     Allergies:   Hydrochlorothiazide and Losartan potassium    Social History:  The patient  reports that she has never smoked. She has never used smokeless tobacco. She reports that she does not drink alcohol or use drugs.   Family History:  The patient's family history includes Diabetes in her father and mother; Heart attack in her father and mother.    ROS:  Please see the history of present illness.   Otherwise, review of systems are positive for none.   All other systems are reviewed and negative.    PHYSICAL EXAM: VS:  BP 140/60   Pulse 63   Resp 20   Ht 5\' 4"  (1.626 m)   Wt 146 lb (66.2 kg)   SpO2 98%   BMI 25.06 kg/m  , BMI Body mass index is 25.06 kg/m. Affect appropriate Pale female  HEENT: normal Neck supple with no adenopathy JVP normal left bruits no thyromegaly Lungs clear with no wheezing and good diaphragmatic motion Heart:  S1/S2 3/6/ SEM murmur, no rub, gallop or click PMI normal Abdomen:  benighn, BS positve, no tenderness, no AAA no bruit.  No HSM or HJR Distal pulses intact with no bruits No edema Neuro non-focal Skin warm and dry No muscular weakness    EKG:  09/15/16 SR nonspecific ST changes in inferior leads no old One to compare   Recent Labs: 01/13/2016: Magnesium 1.7 09/17/2016: ALT(SGPT) 31; BUN, Bld 27; Creat 1.4; HGB 6.3; Platelets 209; Potassium 4.6; Sodium 138    Lipid Panel    Component Value Date/Time   CHOL 156 01/13/2016 0842   TRIG 360 (H) 01/13/2016 0842   HDL 46 01/13/2016 0842   CHOLHDL 3.4 01/13/2016 0842   VLDL 72 (H) 01/13/2016 0842   LDLCALC 38 01/13/2016 0842      Wt Readings from Last 3 Encounters:  09/21/16 146 lb (66.2 kg)  09/17/16 144 lb (65.3 kg)  09/15/16 146 lb 8 oz  (66.5 kg)      Other studies Reviewed: Additional studies/ records that were reviewed today include: Notes Dr Hilliard Clark and primary ECG Lab work .    ASSESSMENT AND PLAN:  1.  Preop Long standing IDDM sedentary abnormal ECG with left bruit Lexiscan myovue to clear for surgery  2. DM Discussed low carb diet.  Target hemoglobin A1c is 6.5 or less.  Continue current medications. Will likely need insulin drip during surgery given use of pump   3. HTN Well controlled.  Continue current medications and low sodium Dash type diet.    4. Chol: continue statin   5. Anemia labs at cancer center transfusion next week  6. Murmur:  Aortic sclerosis murmur f/u echo   7. Bruit: can;t tell if referred AV murmur or primary bruit f/u carotid duplex   Current medicines are reviewed at length with the patient today.  The patient does not have concerns regarding medicines.  The following changes have been made:  no change  Labs/ tests ordered today include: Myovue, Echo Carotid duplex  Orders Placed This Encounter  Procedures  . Myocardial Perfusion Imaging  . ECHOCARDIOGRAM COMPLETE     Disposition:   FU with 6 months      Signed, Jenkins Rouge, MD  09/21/2016 11:50 AM    [facility] [place], [place], [place]  [address] Phone: [phone]; Fax: [phone]

## 2016-09-20 NOTE — Patient Instructions (Signed)
Blood Transfusion , Adult A blood transfusion is a procedure in which you receive donated blood, including plasma, platelets, and red blood cells, through an IV tube. You may need a blood transfusion because of illness, surgery, or injury. The blood may come from a donor. You may also be able to donate blood for yourself (autologous blood donation) before a surgery if you know that you might require a blood transfusion. The blood given in a transfusion is made up of different types of cells. You may receive:  Red blood cells. These carry oxygen to the cells in the body.  White blood cells. These help you fight infections.  Platelets. These help your blood to clot.  Plasma. This is the liquid part of your blood and it helps with fluid imbalances. If you have hemophilia or another clotting disorder, you may also receive other types of blood products. Tell a health care provider about:  Any allergies you have.  All medicines you are taking, including vitamins, herbs, eye drops, creams, and over-the-counter medicines.  Any problems you or family members have had with anesthetic medicines.  Any blood disorders you have.  Any surgeries you have had.  Any medical conditions you have, including any recent fever or cold symptoms.  Whether you are pregnant or may be pregnant.  Any previous reactions you have had during a blood transfusion. What are the risks? Generally, this is a safe procedure. However, problems may occur, including:  Having an allergic reaction to something in the donated blood. Hives and itching may be symptoms of this type of reaction.  Fever. This may be a reaction to the white blood cells in the transfused blood. Nausea or chest pain may accompany a fever.  Iron overload. This can happen from having many transfusions.  Transfusion-related acute lung injury (TRALI). This is a rare reaction that causes lung damage. The cause is not known.TRALI can occur within hours  of a transfusion or several days later.  Sudden (acute) or delayed hemolytic reactions. This happens if your blood does not match the cells in your transfusion. Your body's defense system (immune system) may try to attack the new cells. This complication is rare. The symptoms include fever, chills, nausea, and low back pain or chest pain.  Infection or disease transmission. This is rare. What happens before the procedure?  You will have a blood test to determine your blood type. This is necessary to know what kind of blood your body will accept and to match it to the donor blood.  If you are going to have a planned surgery, you may be able to do an autologous blood donation. This may be done in case you need to have a transfusion.  If you have had an allergic reaction to a transfusion in the past, you may be given medicine to help prevent a reaction. This medicine may be given to you by mouth or through an IV tube.  You will have your temperature, blood pressure, and pulse monitored before the transfusion.  Follow instructions from your health care provider about eating and drinking restrictions.  Ask your health care provider about:  Changing or stopping your regular medicines. This is especially important if you are taking diabetes medicines or blood thinners.  Taking medicines such as aspirin and ibuprofen. These medicines can thin your blood. Do not take these medicines before your procedure if your health care provider instructs you not to. What happens during the procedure?  An IV tube will be   inserted into one of your veins.  The bag of donated blood will be attached to your IV tube. The blood will then enter through your vein.  Your temperature, blood pressure, and pulse will be monitored regularly during the transfusion. This monitoring is done to detect early signs of a transfusion reaction.  If you have any signs or symptoms of a reaction, your transfusion will be stopped and  you may be given medicine.  When the transfusion is complete, your IV tube will be removed.  Pressure may be applied to the IV site for a few minutes.  A bandage (dressing) will be applied. The procedure may vary among health care providers and hospitals. What happens after the procedure?  Your temperature, blood pressure, heart rate, breathing rate, and blood oxygen level will be monitored often.  Your blood may be tested to see how you are responding to the transfusion.  You may be warmed with fluids or blankets to maintain a normal body temperature. Summary  A blood transfusion is a procedure in which you receive donated blood, including plasma, platelets, and red blood cells, through an IV tube.  Your temperature, blood pressure, and pulse will be monitored before, during, and after the transfusion.  Your blood may be tested after the transfusion to see how your body has responded. This information is not intended to replace advice given to you by your health care provider. Make sure you discuss any questions you have with your health care provider. Document Released: 08/20/2000 Document Revised: 05/20/2016 Document Reviewed: 05/20/2016 Elsevier Interactive Patient Education  2017 [employer]vier Inc.  

## 2016-09-21 ENCOUNTER — Encounter: Payer: Self-pay | Admitting: Cardiovascular Disease

## 2016-09-21 ENCOUNTER — Telehealth (HOSPITAL_COMMUNITY): Payer: Self-pay | Admitting: *Deleted

## 2016-09-21 ENCOUNTER — Ambulatory Visit (INDEPENDENT_AMBULATORY_CARE_PROVIDER_SITE_OTHER): Payer: Medicare Other | Admitting: Cardiovascular Disease

## 2016-09-21 VITALS — BP 140/60 | HR 63 | Resp 20 | Ht 64.0 in | Wt 146.0 lb

## 2016-09-21 DIAGNOSIS — Z7689 Persons encountering health services in other specified circumstances: Secondary | ICD-10-CM

## 2016-09-21 DIAGNOSIS — Z01818 Encounter for other preprocedural examination: Secondary | ICD-10-CM

## 2016-09-21 DIAGNOSIS — R0989 Other specified symptoms and signs involving the circulatory and respiratory systems: Secondary | ICD-10-CM | POA: Diagnosis not present

## 2016-09-21 DIAGNOSIS — R011 Cardiac murmur, unspecified: Secondary | ICD-10-CM

## 2016-09-21 LAB — TYPE AND SCREEN
ABO/RH(D): O POS
Antibody Screen: NEGATIVE
UNIT DIVISION: 0
UNIT DIVISION: 0
Unit division: 0

## 2016-09-21 NOTE — Patient Instructions (Addendum)
Medication Instructions:  Your physician recommends that you continue on your current medications as directed. Please refer to the Current Medication list given to you today.  Labwork: NONE  Testing/Procedures: Your physician has requested that you have an echocardiogram as soon as possible. Echocardiography is a painless test that uses sound waves to create images of your heart. It provides your doctor with information about the size and shape of your heart and how well your heart's chambers and valves are working. This procedure takes approximately one hour. There are no restrictions for this procedure.  Your physician has requested that you have a carotid duplex as soon as possible. This test is an ultrasound of the carotid arteries in your neck. It looks at blood flow through these arteries that supply the brain with blood. Allow one hour for this exam. There are no restrictions or special instructions.  Your physician has requested that you have a lexiscan myoview as soon as possible. For further information please visit [email or web address]. Please follow instruction sheet, as given.  Follow-Up: Your physician wants you to follow-up in: 6 months with Dr. Johnsie Cancel. You will receive a reminder letter in the mail two months in advance. If you don't receive a letter, please call our office to schedule the follow-up appointment.   If you need a refill on your cardiac medications before your next appointment, please call your pharmacy.

## 2016-09-21 NOTE — Telephone Encounter (Signed)
Patient's husband per DPR given detailed instructions per Myocardial Perfusion Study Information Sheet for the test on 09/24/16 at 0815. Patient notified to arrive 15 minutes early and that it is imperative to arrive on time for appointment to keep from having the test rescheduled.  If you need to cancel or reschedule your appointment, please call the office within 24 hours of your appointment. Failure to do so may result in a cancellation of your appointment, and a $50 no show fee. Patient verbalized understanding.[name], Ranae Palms

## 2016-09-23 ENCOUNTER — Inpatient Hospital Stay (HOSPITAL_COMMUNITY): Admission: RE | Admit: 2016-09-23 | Payer: Medicare Other | Source: Ambulatory Visit

## 2016-09-24 ENCOUNTER — Other Ambulatory Visit: Payer: Self-pay | Admitting: Cardiovascular Disease

## 2016-09-24 ENCOUNTER — Ambulatory Visit (HOSPITAL_COMMUNITY): Payer: Medicare Other | Attending: Cardiovascular Disease

## 2016-09-24 DIAGNOSIS — Z01818 Encounter for other preprocedural examination: Secondary | ICD-10-CM | POA: Diagnosis not present

## 2016-09-24 DIAGNOSIS — R011 Cardiac murmur, unspecified: Secondary | ICD-10-CM

## 2016-09-24 DIAGNOSIS — R9439 Abnormal result of other cardiovascular function study: Secondary | ICD-10-CM | POA: Diagnosis not present

## 2016-09-24 DIAGNOSIS — R0989 Other specified symptoms and signs involving the circulatory and respiratory systems: Secondary | ICD-10-CM

## 2016-09-24 LAB — MYOCARDIAL PERFUSION IMAGING
CHL CUP NUCLEAR SSS: 10
CSEPPHR: 99 {beats}/min
LV sys vol: 21 mL
LVDIAVOL: 58 mL (ref 46–106)
RATE: 0.19
Rest HR: 63 {beats}/min
SDS: 6
SRS: 4
TID: 0.98

## 2016-09-24 MED ORDER — REGADENOSON 0.4 MG/5ML IV SOLN
0.4000 mg | Freq: Once | INTRAVENOUS | Status: AC
  Administered 2016-09-24: 0.4 mg via INTRAVENOUS

## 2016-09-24 MED ORDER — TECHNETIUM TC 99M TETROFOSMIN IV KIT
10.4000 | PACK | Freq: Once | INTRAVENOUS | Status: AC | PRN
  Administered 2016-09-24: 10.4 via INTRAVENOUS
  Filled 2016-09-24: qty 11

## 2016-09-24 MED ORDER — TECHNETIUM TC 99M TETROFOSMIN IV KIT
29.6000 | PACK | Freq: Once | INTRAVENOUS | Status: AC | PRN
  Administered 2016-09-24: 29.6 via INTRAVENOUS
  Filled 2016-09-24: qty 30

## 2016-09-27 ENCOUNTER — Telehealth: Payer: Self-pay

## 2016-09-27 ENCOUNTER — Ambulatory Visit (HOSPITAL_COMMUNITY)
Admission: RE | Admit: 2016-09-27 | Discharge: 2016-09-27 | Disposition: A | Discharge status: Home / Self Care | Payer: Medicare Other | Source: Ambulatory Visit | Attending: Internal Medicine | Admitting: Internal Medicine

## 2016-09-27 ENCOUNTER — Telehealth: Payer: Self-pay | Admitting: Interventional Cardiology

## 2016-09-27 ENCOUNTER — Other Ambulatory Visit: Payer: Self-pay | Admitting: Cardiovascular Disease

## 2016-09-27 ENCOUNTER — Other Ambulatory Visit: Payer: Medicare Other | Admitting: *Deleted

## 2016-09-27 DIAGNOSIS — R0989 Other specified symptoms and signs involving the circulatory and respiratory systems: Secondary | ICD-10-CM | POA: Diagnosis not present

## 2016-09-27 DIAGNOSIS — Z01812 Encounter for preprocedural laboratory examination: Secondary | ICD-10-CM

## 2016-09-27 DIAGNOSIS — I6523 Occlusion and stenosis of bilateral carotid arteries: Secondary | ICD-10-CM | POA: Diagnosis not present

## 2016-09-27 DIAGNOSIS — I2510 Atherosclerotic heart disease of native coronary artery without angina pectoris: Secondary | ICD-10-CM

## 2016-09-27 NOTE — Telephone Encounter (Signed)
New Message     Pt is having heart cath on Thursday 09/30/16, will this interfere with her infusion at the cancer center 10/01/16

## 2016-09-27 NOTE — Telephone Encounter (Signed)
Patient aware of stress test results. Patient having heart cath on Thursday, January 25, with Dr. Tamala Julian. Went over instructions for procedure and will leave a copy at the front desk for patient to pick up today when she comes in for lab work.

## 2016-09-27 NOTE — Telephone Encounter (Signed)
Informed patient that her heart cath will be outpatient coming and leaving on the same day, unless she has a stent placed. Informed patient if this happen she will stay one night. Patient is concerned about making her Friday appointment at the cancer center for blood transfusion. Informed patient if she stays the night and she needs a blood transfusion, that she might be able to have it done at the hospital. Encouraged patient to call the cancer center and let them know what is going on. Will be calling patient tomorrow with lab work results.

## 2016-09-27 NOTE — Telephone Encounter (Signed)
Updated patient's medication list. Patient stated she does not take clonidine and is on a insulin pump.

## 2016-09-27 NOTE — Telephone Encounter (Signed)
-----   Message from Josue Hector, MD sent at 09/26/2016  8:32 PM EST ----- [name] abnormal needs cath this week to clear for surgery

## 2016-09-28 ENCOUNTER — Encounter: Payer: Self-pay | Admitting: Hematology & Oncology

## 2016-09-28 ENCOUNTER — Other Ambulatory Visit: Payer: Self-pay | Admitting: Physician Assistant

## 2016-09-28 ENCOUNTER — Telehealth: Payer: Self-pay | Admitting: *Deleted

## 2016-09-28 ENCOUNTER — Other Ambulatory Visit: Payer: Self-pay | Admitting: Interventional Cardiology

## 2016-09-28 DIAGNOSIS — I25700 Atherosclerosis of coronary artery bypass graft(s), unspecified, with unstable angina pectoris: Secondary | ICD-10-CM

## 2016-09-28 LAB — BASIC METABOLIC PANEL
BUN/Creatinine Ratio: 25 (ref 12–28)
BUN: 25 mg/dL (ref 8–27)
CO2: 20 mmol/L (ref 18–29)
Calcium: 9.6 mg/dL (ref 8.7–10.3)
Chloride: 107 mmol/L — ABNORMAL HIGH (ref 96–106)
Creatinine, Ser: 1.02 mg/dL — ABNORMAL HIGH (ref 0.57–1.00)
GFR, EST AFRICAN AMERICAN: 64 mL/min/{1.73_m2} (ref >59)
GFR, EST NON AFRICAN AMERICAN: 55 mL/min/{1.73_m2} — AB (ref >59)
Glucose: 110 mg/dL — ABNORMAL HIGH (ref 65–99)
POTASSIUM: 5 mmol/L (ref 3.5–5.2)
SODIUM: 143 mmol/L (ref 134–144)

## 2016-09-28 LAB — CBC WITH DIFFERENTIAL/PLATELET
BASOS: 0 %
Basophils Absolute: 0 10*3/uL (ref 0.0–0.2)
EOS (ABSOLUTE): 0.4 10*3/uL (ref 0.0–0.4)
Eos: 9 %
HEMATOCRIT: 32.8 % — AB (ref 34.0–46.6)
Hemoglobin: 9.9 g/dL — ABNORMAL LOW (ref 11.1–15.9)
IMMATURE GRANS (ABS): 0 10*3/uL (ref 0.0–0.1)
Immature Granulocytes: 1 %
Lymphocytes Absolute: 1.2 10*3/uL (ref 0.7–3.1)
Lymphs: 27 %
MCH: 24 pg — ABNORMAL LOW (ref 26.6–33.0)
MCHC: 30.2 g/dL — AB (ref 31.5–35.7)
MCV: 80 fL (ref 79–97)
Monocytes Absolute: 0.4 10*3/uL (ref 0.1–0.9)
Monocytes: 9 %
Neutrophils Absolute: 2.3 10*3/uL (ref 1.4–7.0)
Neutrophils: 54 %
Platelets: 188 10*3/uL (ref 150–379)
RBC: 4.12 x10E6/uL (ref 3.77–5.28)
RDW: 23.9 % — AB (ref 12.3–15.4)
WBC: 4.3 10*3/uL (ref 3.4–10.8)

## 2016-09-28 LAB — PROTIME-INR
INR: 1 (ref 0.8–1.2)
Prothrombin Time: 10.7 s (ref 9.1–12.0)

## 2016-09-28 MED ORDER — DEXTROSE-NACL 5-0.9 % IV SOLN: INTRAVENOUS | Status: DC

## 2016-09-28 NOTE — Telephone Encounter (Signed)
Wrote order for D5 NS at 100 cc/hr.

## 2016-09-28 NOTE — Telephone Encounter (Signed)
Received call from patient stating that she may have to cancel her Friday appt  Because she has a heart cath on Thursday.  Told patient to call us Thursday morning to let me know if she needs to cancel because of Dr. Marin Olp tight schedule, worried we may not be able to get her in next week. She said she would call us if she happens to be admitted.

## 2016-09-28 NOTE — Telephone Encounter (Signed)
Pt has Insulin pump and wants to know if she can wear it during Cath? pls advise [phone]

## 2016-09-28 NOTE — Telephone Encounter (Signed)
Spoke with patient about her insulin pump. Informed patient she should follow instructions about her pump when she is not eating. Patient states that would be half dose. Informed patient that she does not need her insulin pump during her procedure. Informed her that Dr. Tamala Julian is aware that she has an insulin pump and will make adjustments to IV fluids as needed during her short stay. Patient verbalized understanding. Will forward to Dr. Tamala Julian, so he is aware.

## 2016-09-29 NOTE — H&P (Signed)
IDDM Cecal Cancer Severe anemia Mild to moderate abn nuclear Abnormal ECG

## 2016-09-30 ENCOUNTER — Other Ambulatory Visit (HOSPITAL_COMMUNITY): Payer: Medicare Other

## 2016-09-30 ENCOUNTER — Encounter (HOSPITAL_COMMUNITY)
Admission: RE | Disposition: A | Discharge status: Home / Self Care | Payer: Self-pay | Source: Ambulatory Visit | Attending: Interventional Cardiology

## 2016-09-30 ENCOUNTER — Encounter (HOSPITAL_COMMUNITY): Payer: Self-pay | Admitting: Interventional Cardiology

## 2016-09-30 ENCOUNTER — Ambulatory Visit (HOSPITAL_COMMUNITY)
Admission: RE | Admit: 2016-09-30 | Discharge: 2016-09-30 | Disposition: A | Discharge status: Home / Self Care | Payer: Medicare Other | Source: Ambulatory Visit | Attending: Interventional Cardiology | Admitting: Interventional Cardiology

## 2016-09-30 ENCOUNTER — Ambulatory Visit (HOSPITAL_BASED_OUTPATIENT_CLINIC_OR_DEPARTMENT_OTHER)
Admission: RE | Admit: 2016-09-30 | Discharge: 2016-09-30 | Disposition: A | Discharge status: Home / Self Care | Payer: Medicare Other | Source: Ambulatory Visit | Attending: Cardiovascular Disease | Admitting: Cardiovascular Disease

## 2016-09-30 DIAGNOSIS — Z8249 Family history of ischemic heart disease and other diseases of the circulatory system: Secondary | ICD-10-CM | POA: Diagnosis not present

## 2016-09-30 DIAGNOSIS — D500 Iron deficiency anemia secondary to blood loss (chronic): Secondary | ICD-10-CM | POA: Diagnosis not present

## 2016-09-30 DIAGNOSIS — N183 Chronic kidney disease, stage 3 unspecified: Secondary | ICD-10-CM | POA: Diagnosis present

## 2016-09-30 DIAGNOSIS — R011 Cardiac murmur, unspecified: Secondary | ICD-10-CM

## 2016-09-30 DIAGNOSIS — E118 Type 2 diabetes mellitus with unspecified complications: Secondary | ICD-10-CM

## 2016-09-30 DIAGNOSIS — R9439 Abnormal result of other cardiovascular function study: Secondary | ICD-10-CM | POA: Diagnosis present

## 2016-09-30 DIAGNOSIS — Z794 Long term (current) use of insulin: Secondary | ICD-10-CM

## 2016-09-30 DIAGNOSIS — I2510 Atherosclerotic heart disease of native coronary artery without angina pectoris: Secondary | ICD-10-CM | POA: Diagnosis not present

## 2016-09-30 DIAGNOSIS — I10 Essential (primary) hypertension: Secondary | ICD-10-CM | POA: Insufficient documentation

## 2016-09-30 DIAGNOSIS — E781 Pure hyperglyceridemia: Secondary | ICD-10-CM | POA: Diagnosis present

## 2016-09-30 DIAGNOSIS — I2584 Coronary atherosclerosis due to calcified coronary lesion: Secondary | ICD-10-CM | POA: Insufficient documentation

## 2016-09-30 DIAGNOSIS — I350 Nonrheumatic aortic (valve) stenosis: Secondary | ICD-10-CM | POA: Diagnosis not present

## 2016-09-30 DIAGNOSIS — Z9641 Presence of insulin pump (external) (internal): Secondary | ICD-10-CM | POA: Diagnosis not present

## 2016-09-30 DIAGNOSIS — C180 Malignant neoplasm of cecum: Secondary | ICD-10-CM | POA: Diagnosis not present

## 2016-09-30 DIAGNOSIS — E119 Type 2 diabetes mellitus without complications: Secondary | ICD-10-CM | POA: Insufficient documentation

## 2016-09-30 DIAGNOSIS — Z833 Family history of diabetes mellitus: Secondary | ICD-10-CM | POA: Insufficient documentation

## 2016-09-30 DIAGNOSIS — E782 Mixed hyperlipidemia: Secondary | ICD-10-CM | POA: Diagnosis present

## 2016-09-30 DIAGNOSIS — E785 Hyperlipidemia, unspecified: Secondary | ICD-10-CM | POA: Diagnosis not present

## 2016-09-30 DIAGNOSIS — Z01818 Encounter for other preprocedural examination: Secondary | ICD-10-CM

## 2016-09-30 HISTORY — PX: CARDIAC CATHETERIZATION: SHX172

## 2016-09-30 LAB — ECHOCARDIOGRAM COMPLETE
HEIGHTINCHES: 64 in
Weight: 2336 oz

## 2016-09-30 LAB — GLUCOSE, CAPILLARY
Glucose-Capillary: 130 mg/dL — ABNORMAL HIGH (ref 65–99)
Glucose-Capillary: 157 mg/dL — ABNORMAL HIGH (ref 65–99)

## 2016-09-30 SURGERY — LEFT HEART CATH AND CORONARY ANGIOGRAPHY

## 2016-09-30 MED ORDER — VERAPAMIL HCL 2.5 MG/ML IV SOLN
INTRAVENOUS | Status: DC | PRN
  Administered 2016-09-30: 09:00:00 via INTRA_ARTERIAL

## 2016-09-30 MED ORDER — HEPARIN SODIUM (PORCINE) 1000 UNIT/ML IJ SOLN
INTRAMUSCULAR | Status: DC | PRN
  Administered 2016-09-30: 3500 [IU] via INTRAVENOUS

## 2016-09-30 MED ORDER — FENTANYL CITRATE (PF) 100 MCG/2ML IJ SOLN
INTRAMUSCULAR | Status: AC
  Filled 2016-09-30: qty 2

## 2016-09-30 MED ORDER — VERAPAMIL HCL 2.5 MG/ML IV SOLN
INTRAVENOUS | Status: AC
  Filled 2016-09-30: qty 2

## 2016-09-30 MED ORDER — IOPAMIDOL (ISOVUE-370) INJECTION 76%
INTRAVENOUS | Status: DC | PRN
  Administered 2016-09-30: 70 mL via INTRA_ARTERIAL

## 2016-09-30 MED ORDER — SODIUM CHLORIDE 0.9% FLUSH: 3.0000 mL | Freq: Two times a day (BID) | INTRAVENOUS | Status: DC

## 2016-09-30 MED ORDER — SODIUM CHLORIDE 0.9 % IV SOLN: 250.0000 mL | INTRAVENOUS | Status: DC | PRN

## 2016-09-30 MED ORDER — HEPARIN (PORCINE) IN NACL 2-0.9 UNIT/ML-% IJ SOLN
INTRAMUSCULAR | Status: AC
  Filled 2016-09-30: qty 1000

## 2016-09-30 MED ORDER — OXYCODONE-ACETAMINOPHEN 5-325 MG PO TABS: 1.0000 | ORAL_TABLET | ORAL | Status: DC | PRN

## 2016-09-30 MED ORDER — HEPARIN SODIUM (PORCINE) 1000 UNIT/ML IJ SOLN
INTRAMUSCULAR | Status: AC
  Filled 2016-09-30: qty 1

## 2016-09-30 MED ORDER — MIDAZOLAM HCL 2 MG/2ML IJ SOLN
INTRAMUSCULAR | Status: AC
  Filled 2016-09-30: qty 2

## 2016-09-30 MED ORDER — HEPARIN (PORCINE) IN NACL 2-0.9 UNIT/ML-% IJ SOLN
INTRAMUSCULAR | Status: DC | PRN
  Administered 2016-09-30: 1000 mL via INTRA_ARTERIAL

## 2016-09-30 MED ORDER — LIDOCAINE HCL (PF) 1 % IJ SOLN
INTRAMUSCULAR | Status: DC | PRN
  Administered 2016-09-30: 1 mL via INTRADERMAL

## 2016-09-30 MED ORDER — ONDANSETRON HCL 4 MG/2ML IJ SOLN: 4.0000 mg | Freq: Four times a day (QID) | INTRAMUSCULAR | Status: DC | PRN

## 2016-09-30 MED ORDER — ASPIRIN 81 MG PO CHEW
CHEWABLE_TABLET | ORAL | Status: AC
  Filled 2016-09-30: qty 1

## 2016-09-30 MED ORDER — SODIUM CHLORIDE 0.9% FLUSH: 3.0000 mL | INTRAVENOUS | Status: DC | PRN

## 2016-09-30 MED ORDER — DEXTROSE-NACL 5-0.45 % IV SOLN: INTRAVENOUS | Status: DC

## 2016-09-30 MED ORDER — LIDOCAINE HCL (PF) 1 % IJ SOLN
INTRAMUSCULAR | Status: AC
  Filled 2016-09-30: qty 30

## 2016-09-30 MED ORDER — SODIUM CHLORIDE 0.9 % WEIGHT BASED INFUSION
3.0000 mL/kg/h | INTRAVENOUS | Status: DC
  Administered 2016-09-30: 3 mL/kg/h via INTRAVENOUS

## 2016-09-30 MED ORDER — ASPIRIN 81 MG PO CHEW
81.0000 mg | CHEWABLE_TABLET | ORAL | Status: AC
  Administered 2016-09-30: 81 mg via ORAL

## 2016-09-30 MED ORDER — DEXTROSE-NACL 5-0.45 % IV SOLN
INTRAVENOUS | Status: DC | PRN
  Administered 2016-09-30: 198 mL/h via INTRAVENOUS

## 2016-09-30 MED ORDER — IOPAMIDOL (ISOVUE-370) INJECTION 76%
INTRAVENOUS | Status: AC
Start: 2016-09-30 — End: 2016-09-30
  Filled 2016-09-30: qty 100

## 2016-09-30 MED ORDER — SODIUM CHLORIDE 0.9 % WEIGHT BASED INFUSION: 1.0000 mL/kg/h | INTRAVENOUS | Status: DC

## 2016-09-30 MED ORDER — ACETAMINOPHEN 325 MG PO TABS: 650.0000 mg | ORAL_TABLET | ORAL | Status: DC | PRN

## 2016-09-30 MED ORDER — MIDAZOLAM HCL 2 MG/2ML IJ SOLN
INTRAMUSCULAR | Status: DC | PRN
  Administered 2016-09-30 (×2): 1 mg via INTRAVENOUS

## 2016-09-30 MED ORDER — FENTANYL CITRATE (PF) 100 MCG/2ML IJ SOLN
INTRAMUSCULAR | Status: DC | PRN
  Administered 2016-09-30: 50 ug via INTRAVENOUS

## 2016-09-30 SURGICAL SUPPLY — 10 items
CATH 5FR JL3.5 JR4 ANG PIG MP (CATHETERS) ×2 IMPLANT
DEVICE RAD COMP TR BAND LRG (VASCULAR PRODUCTS) ×2 IMPLANT
GLIDESHEATH SLEND A-KIT 6F 22G (SHEATH) ×2 IMPLANT
GUIDEWIRE INQWIRE 1.5J.035X260 (WIRE) ×1 IMPLANT
INQWIRE 1.5J .035X260CM (WIRE) ×2
KIT HEART LEFT (KITS) ×2 IMPLANT
PACK CARDIAC CATHETERIZATION (CUSTOM PROCEDURE TRAY) ×2 IMPLANT
TRANSDUCER W/STOPCOCK (MISCELLANEOUS) ×2 IMPLANT
TUBING CIL FLEX 10 FLL-RA (TUBING) ×2 IMPLANT
WIRE HI TORQ VERSACORE-J 145CM (WIRE) ×2 IMPLANT

## 2016-09-30 NOTE — Progress Notes (Addendum)
Spoke with Dr Primitivo Gauze from [facility] Patient was having a screening colonoscopy when cecal ulcer found And biopsy positive for adenocarcinima Cancer is stage one or two No spread by CT He thinks risk for spread and advancement to advanced stage cancer is low And patient could wait for 3 months for right hemicolectomy given severity of  CAD.  She will get transfusion tomorrow. To see [facility] as outpatient and hopefully Have surgery in next week or two. Increased transfusion risk but otherwise ok  Time spent with patient and family and discussing case with Dr Hilliard Clark 35 minutes   Jenkins Rouge

## 2016-09-30 NOTE — H&P (View-Only) (Signed)
Cardiology Office Note   Date:  09/21/2016   ID:  Anji Terman, DOB 10-09-1945, MRN [id number]  PCP:  [name], PA-C  Cardiologist:   Jenkins Rouge, MD   Chief Complaint  Patient presents with  . Establish Care      History of Present Illness: [name] is a 71 y.o. female who presents for preop evaluation Has newly diagnosed cecal cancer. Found to be very anemic and f/u discovered CA. 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 HTN and elevated lipids. She will require right hemi colectomy ECG abnormal and sent to cardiology for clearance No history of CAD, vascular disease chest pain dyspne or palpitations.   Last Hct 22.2 09/17/16  She has been transfused and getting another one next week at cancer center Sedentary. Has not had previous stress testing has had a murmur in past also Not looked into.    Surgery schedule first week in February with Dr Rosalio Loud pending cardiac clearance   Past Medical History:  Diagnosis Date  . Diabetes mellitus without complication ([place])   . Hyperlipidemia   . Hypertension   . Iron deficiency anemia due to chronic blood loss 09/17/2016  . 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 ABDOMINAL HYSTERECTOMY    . CHOLECYSTECTOMY       Current Outpatient Prescriptions  Medication Sig Dispense Refill  . amLODipine (NORVASC) 5 MG tablet Take one tablet (5 mg total) by mouth daily. 90 tablet 0  . atorvastatin (LIPITOR) 20 MG tablet Take 10 mg by mouth daily.    . Calcium Carbonate-Vitamin D (CALTRATE 600+D PO) Take by mouth.    . cloNIDine (CATAPRES) 0.2 MG tablet Take 0.2 mg by mouth 2 (two) times daily.    Vanessa Kick Ethyl 1 g CAPS Take 2 g by mouth 2 (two) times daily. 120 capsule 4  . metFORMIN (GLUCOPHAGE-XR) 500 MG 24 hr tablet Take two tablets (1,000 mg total) by mouth 2 (two) times daily with meals.  5  . omeprazole (PRILOSEC) 40 MG capsule Take 40 mg by mouth 2 (two)  times daily.    . valsartan (DIOVAN) 160 MG tablet Take one tablet (160 mg total) by mouth daily. 90 tablet 0  . Vitamin D, Cholecalciferol, 400 units CAPS Take by mouth.     No current facility-administered medications for this visit.     Allergies:   Hydrochlorothiazide and Losartan potassium    Social History:  The patient  reports that she has never smoked. She has never used smokeless tobacco. She reports that she does not drink alcohol or use drugs.   Family History:  The patient's family history includes Diabetes in her father and mother; Heart attack in her father and mother.    ROS:  Please see the history of present illness.   Otherwise, review of systems are positive for none.   All other systems are reviewed and negative.    PHYSICAL EXAM: VS:  BP 140/60   Pulse 63   Resp 20   Ht 5\' 4"  (1.626 m)   Wt 146 lb (66.2 kg)   SpO2 98%   BMI 25.06 kg/m  , BMI Body mass index is 25.06 kg/m. Affect appropriate Pale female  HEENT: normal Neck supple with no adenopathy JVP normal left bruits no thyromegaly Lungs clear with no wheezing and good diaphragmatic motion Heart:  S1/S2 3/6/ SEM murmur, no rub, gallop or click PMI normal Abdomen:  benighn, BS positve, no tenderness, no AAA no bruit.  No HSM or HJR Distal pulses intact with no bruits No edema Neuro non-focal Skin warm and dry No muscular weakness    EKG:  09/15/16 SR nonspecific ST changes in inferior leads no old One to compare   Recent Labs: 01/13/2016: Magnesium 1.7 09/17/2016: ALT(SGPT) 31; BUN, Bld 27; Creat 1.4; HGB 6.3; Platelets 209; Potassium 4.6; Sodium 138    Lipid Panel    Component Value Date/Time   CHOL 156 01/13/2016 0842   TRIG 360 (H) 01/13/2016 0842   HDL 46 01/13/2016 0842   CHOLHDL 3.4 01/13/2016 0842   VLDL 72 (H) 01/13/2016 0842   LDLCALC 38 01/13/2016 0842      Wt Readings from Last 3 Encounters:  09/21/16 146 lb (66.2 kg)  09/17/16 144 lb (65.3 kg)  09/15/16 146 lb 8 oz  (66.5 kg)      Other studies Reviewed: Additional studies/ records that were reviewed today include: Notes Dr Hilliard Clark and primary ECG Lab work .    ASSESSMENT AND PLAN:  1.  Preop Long standing IDDM sedentary abnormal ECG with left bruit Lexiscan myovue to clear for surgery  2. DM Discussed low carb diet.  Target hemoglobin A1c is 6.5 or less.  Continue current medications. Will likely need insulin drip during surgery given use of pump   3. HTN Well controlled.  Continue current medications and low sodium Dash type diet.    4. Chol: continue statin   5. Anemia labs at cancer center transfusion next week  6. Murmur:  Aortic sclerosis murmur f/u echo   7. Bruit: can;t tell if referred AV murmur or primary bruit f/u carotid duplex   Current medicines are reviewed at length with the patient today.  The patient does not have concerns regarding medicines.  The following changes have been made:  no change  Labs/ tests ordered today include: Myovue, Echo Carotid duplex  Orders Placed This Encounter  Procedures  . Myocardial Perfusion Imaging  . ECHOCARDIOGRAM COMPLETE     Disposition:   FU with 6 months      Signed, Jenkins Rouge, MD  09/21/2016 11:50 AM    [facility] [place], [place], [place]  [address] Phone: [phone]; Fax: [phone]

## 2016-09-30 NOTE — Interval H&P Note (Signed)
Cath Lab Visit (complete for each Cath Lab visit)  Clinical Evaluation Leading to the Procedure:   ACS: No.  Non-ACS:    Anginal Classification: No Symptoms  Anti-ischemic medical therapy: Minimal Therapy (1 class of medications)  Non-Invasive Test Results: Intermediate-risk stress test findings: cardiac mortality 1-3%/year  Prior CABG: No previous CABG      History and Physical Interval Note:  09/30/2016 8:21 AM  [name]  has presented today for surgery, with the diagnosis of abnormal myoview  The various methods of treatment have been discussed with the patient and family. After consideration of risks, benefits and other options for treatment, the patient has consented to  Procedure(s): Left Heart Cath and Coronary Angiography (N/A) as a surgical intervention .  The patient's history has been reviewed, patient examined, no change in status, stable for surgery.  I have reviewed the patient's chart and labs.  Questions were answered to the patient's satisfaction.     Belva Crome III

## 2016-09-30 NOTE — Progress Notes (Signed)
Note insulin pump.  Called short stay.  Insulin pump back on patient.  No needs at this time.   Thanks, Adah Perl, RN, BC-ADM Inpatient Diabetes Coordinator Pager [phone] (8a-5p)

## 2016-09-30 NOTE — Progress Notes (Signed)
  Echocardiogram 2D Echocardiogram has been performed.  [name] 09/30/2016, 8:30 AM

## 2016-09-30 NOTE — Discharge Instructions (Signed)

## 2016-10-01 ENCOUNTER — Other Ambulatory Visit (HOSPITAL_BASED_OUTPATIENT_CLINIC_OR_DEPARTMENT_OTHER): Payer: Medicare Other

## 2016-10-01 ENCOUNTER — Encounter: Payer: Self-pay | Admitting: Hematology & Oncology

## 2016-10-01 ENCOUNTER — Ambulatory Visit (HOSPITAL_BASED_OUTPATIENT_CLINIC_OR_DEPARTMENT_OTHER): Payer: Medicare Other

## 2016-10-01 ENCOUNTER — Ambulatory Visit (HOSPITAL_BASED_OUTPATIENT_CLINIC_OR_DEPARTMENT_OTHER): Payer: Medicare Other | Admitting: Hematology & Oncology

## 2016-10-01 VITALS — BP 143/72 | HR 62 | Temp 97.9°F | Resp 18 | Wt 142.1 lb

## 2016-10-01 DIAGNOSIS — K909 Intestinal malabsorption, unspecified: Secondary | ICD-10-CM

## 2016-10-01 DIAGNOSIS — C182 Malignant neoplasm of ascending colon: Secondary | ICD-10-CM

## 2016-10-01 DIAGNOSIS — D500 Iron deficiency anemia secondary to blood loss (chronic): Secondary | ICD-10-CM

## 2016-10-01 DIAGNOSIS — D649 Anemia, unspecified: Secondary | ICD-10-CM

## 2016-10-01 LAB — CMP (CANCER CENTER ONLY)
ALT(SGPT): 30 U/L (ref 10–47)
AST: 24 U/L (ref 11–38)
Albumin: 3.5 g/dL (ref 3.3–5.5)
Alkaline Phosphatase: 144 U/L — ABNORMAL HIGH (ref 26–84)
BUN: 22 mg/dL (ref 7–22)
CHLORIDE: 106 meq/L (ref 98–108)
CO2: 25 meq/L (ref 18–33)
CREATININE: 1.1 mg/dL (ref 0.6–1.2)
Calcium: 9.7 mg/dL (ref 8.0–10.3)
Glucose, Bld: 192 mg/dL — ABNORMAL HIGH (ref 73–118)
POTASSIUM: 5.1 meq/L — AB (ref 3.3–4.7)
SODIUM: 141 meq/L (ref 128–145)
Total Bilirubin: 0.6 mg/dl (ref 0.20–1.60)
Total Protein: 6.7 g/dL (ref 6.4–8.1)

## 2016-10-01 LAB — CBC WITH DIFFERENTIAL (CANCER CENTER ONLY)
BASO#: 0 10*3/uL (ref 0.0–0.2)
BASO%: 0.5 % (ref 0.0–2.0)
EOS ABS: 0.3 10*3/uL (ref 0.0–0.5)
EOS%: 7.8 % — ABNORMAL HIGH (ref 0.0–7.0)
HCT: 35 % (ref 34.8–46.6)
HGB: 10.8 g/dL — ABNORMAL LOW (ref 11.6–15.9)
LYMPH#: 0.7 10*3/uL — ABNORMAL LOW (ref 0.9–3.3)
LYMPH%: 18.1 % (ref 14.0–48.0)
MCH: 25.6 pg — AB (ref 26.0–34.0)
MCHC: 30.9 g/dL — AB (ref 32.0–36.0)
MCV: 83 fL (ref 81–101)
MONO#: 0.4 10*3/uL (ref 0.1–0.9)
MONO%: 9.6 % (ref 0.0–13.0)
NEUT#: 2.6 10*3/uL (ref 1.5–6.5)
NEUT%: 64 % (ref 39.6–80.0)
PLATELETS: 214 10*3/uL (ref 145–400)
RBC: 4.22 10*6/uL (ref 3.70–5.32)
RDW: 26.3 % — ABNORMAL HIGH (ref 11.1–15.7)
WBC: 4.1 10*3/uL (ref 3.9–10.0)

## 2016-10-01 LAB — IRON AND TIBC
%SAT: 29 % (ref 21–57)
IRON: 103 ug/dL (ref 41–142)
TIBC: 360 ug/dL (ref 236–444)
UIBC: 257 ug/dL (ref 120–384)

## 2016-10-01 LAB — FERRITIN: Ferritin: 253 ng/ml (ref 9–269)

## 2016-10-01 LAB — CHCC SATELLITE - SMEAR

## 2016-10-01 MED ORDER — FERUMOXYTOL INJECTION 510 MG/17 ML
510.0000 mg | Freq: Once | INTRAVENOUS | Status: AC
  Administered 2016-10-01: 510 mg via INTRAVENOUS
  Filled 2016-10-01: qty 17

## 2016-10-01 MED ORDER — SODIUM CHLORIDE 0.9 % IV SOLN: Freq: Once | INTRAVENOUS | Status: DC

## 2016-10-01 NOTE — Patient Instructions (Signed)
Ferumoxytol injection What is this medicine? FERUMOXYTOL is an iron complex. Iron is used to make healthy red blood cells, which carry oxygen and nutrients throughout the body. This medicine is used to treat iron deficiency anemia in people with chronic kidney disease. COMMON BRAND NAME(S): Feraheme What should I tell my health care provider before I take this medicine? They need to know if you have any of these conditions: -anemia not caused by low iron levels -high levels of iron in the blood -magnetic resonance imaging (MRI) test scheduled -an unusual or allergic reaction to iron, other medicines, foods, dyes, or preservatives -pregnant or trying to get pregnant -breast-feeding How should I use this medicine? This medicine is for injection into a vein. It is given by a health care professional in a hospital or clinic setting. Talk to your pediatrician regarding the use of this medicine in children. Special care may be needed. What if I miss a dose? It is important not to miss your dose. Call your doctor or health care professional if you are unable to keep an appointment. What may interact with this medicine? This medicine may interact with the following medications: -other iron products What should I watch for while using this medicine? Visit your doctor or healthcare professional regularly. Tell your doctor or healthcare professional if your symptoms do not start to get better or if they get worse. You may need blood work done while you are taking this medicine. You may need to follow a special diet. Talk to your doctor. Foods that contain iron include: whole grains/cereals, dried fruits, beans, or peas, leafy green vegetables, and organ meats (liver, kidney). What side effects may I notice from receiving this medicine? Side effects that you should report to your doctor or health care professional as soon as possible: -allergic reactions like skin rash, itching or hives, swelling of the  face, lips, or tongue -breathing problems -changes in blood pressure -feeling faint or lightheaded, falls -fever or chills -flushing, sweating, or hot feelings -swelling of the ankles or feet Side effects that usually do not require medical attention (report to your doctor or health care professional if they continue or are bothersome): -diarrhea -headache -nausea, vomiting -stomach pain Where should I keep my medicine? This drug is given in a hospital or clinic and will not be stored at home.  2017 Elsevier/Gold Standard (2015-09-25 12:41:49)  

## 2016-10-01 NOTE — Progress Notes (Signed)
Hematology and Oncology Follow Up Visit  Hector Allain [id number] [birth year] 71 y.o. 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 Sekelsky is back for follow-up. We first saw her back on January 12. At that point time, she had marked iron deficiency anemia secondary to a mass in the ascending colon. This was biopsied and found to be adenocarcinoma. I do not have that actual report.  She would not have surgery because of marked anemia. We found she was profoundly iron deficient. We gave her a dose of IV iron followed by 2 units of blood.  She had a cardiac cath yesterday. 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 She says that she needs to have cardiac surgery prior to colon surgery.  She feels a lot better already. Her hemoglobin has come up over 4 points. She has more stamina. She does not get as short of breath.  She has not noted any melena or bright red blood per rectum.  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 She is on an insulin pump.  Currently, her performance status is ECOG 1.  Medications:  Current Outpatient Prescriptions:  .  Acetaminophen (TYLENOL 8 HOUR ARTHRITIS PAIN PO), Take 1 tablet by mouth every 8 (eight) hours as needed (arthritis)., Disp: , Rfl:  .  amLODipine (NORVASC) 5 MG tablet, Take one tablet (5 mg total) by mouth daily., Disp: 90 tablet, Rfl: 0 .  atorvastatin (LIPITOR) 20 MG tablet, Take 10 mg by mouth daily., Disp: , Rfl:  .  Calcium Carbonate-Vitamin D (CALTRATE 600+D PO), Take 1 tablet by mouth daily. , Disp: , Rfl:  .  cloNIDine (CATAPRES) 0.2 MG tablet, Take 0.2 mg by mouth 2 (two) times daily., Disp: , Rfl:  .  Icosapent Ethyl 1 g CAPS, Take 2 g by mouth 2 (two) times daily., Disp: 120 capsule, Rfl: 4 .  Insulin Human (INSULIN PUMP) SOLN, Inject 1.5-5.5 each into the skin 3 (three) times daily. humalog,  Disp: , Rfl:  .  metFORMIN (GLUCOPHAGE-XR) 500 MG 24 hr tablet, Take two tablets (1,000 mg total) by mouth 2 (two) times daily with meals., Disp: , Rfl: 5 .  omeprazole (PRILOSEC) 40 MG capsule, Take 40 mg by mouth 2 (two) times daily., Disp: , Rfl:  .  valsartan (DIOVAN) 160 MG tablet, Take one tablet (160 mg total) by mouth daily., Disp: 90 tablet, Rfl: 0 .  Vitamin D, Cholecalciferol, 400 units CAPS, Take 800 Units by mouth daily. , Disp: , Rfl:   Current Facility-Administered Medications:  .  dextrose 5 %-0.9 % sodium chloride infusion, , Intravenous, Continuous, Belva Crome, MD  Facility-Administered Medications Ordered in Other Visits:  .  0.9 %  sodium chloride infusion, , Intravenous, Once, Volanda Napoleon, MD .  ferumoxytol [facility]) 510 mg in sodium chloride 0.9 % 100 mL IVPB, 510 mg, Intravenous, Once, Volanda Napoleon, MD  Allergies:  Allergies  Allergen Reactions  . Hydrochlorothiazide     Other reaction(s): Other HYPERCALCEMIA  . Losartan Potassium     Other reaction(s): Other Increased eosinophils    Past Medical History, Surgical history, Social history, and Family History were reviewed and updated.  Review of Systems: As above  Physical Exam:  weight is 142 lb 1.9 oz (64.5 kg). Her oral temperature is 97.9 F (36.6 C). Her blood pressure is 143/72 (abnormal) and her pulse  is 62. Her respiration is 18 and oxygen saturation is 99%.   Wt Readings from Last 3 Encounters:  10/01/16 142 lb 1.9 oz (64.5 kg)  09/30/16 146 lb (66.2 kg)  09/24/16 146 lb (66.2 kg)     Head and neck exam shows no ocular or oral lesions. She has no scleral icterus. Her conjunctiva are quite pale. There is no adenopathy in the neck. Lungs are clear. Cardiac exam regular rate and rhythm with a 2/6 systolic ejection murmur. Abdomen is soft. She has good bowel sounds. She is mildly obese. She has no guarding or rebound tenderness. There is no fluid wave. There is no palpable liver or spleen  tip. Back exam shows no tenderness over the spine, ribs or hips. External he shows no clubbing, cyanosis or edema. Neurological exam shows no focal neurological deficits. Skin exam shows no rashes, ecchymoses or petechia.  Lab Results  Component Value Date   WBC 4.1 10/01/2016   HGB 10.8 (L) 10/01/2016   HCT 35.0 10/01/2016   MCV 83 10/01/2016   PLT 214 10/01/2016     Chemistry      Component Value Date/Time   NA 141 10/01/2016 0935   K 5.1 (H) 10/01/2016 0935   CL 106 10/01/2016 0935   CO2 25 10/01/2016 0935   BUN 22 10/01/2016 0935   CREATININE 1.1 10/01/2016 0935      Component Value Date/Time   CALCIUM 9.7 10/01/2016 0935   ALKPHOS 144 (H) 10/01/2016 0935   AST 24 10/01/2016 0935   ALT 30 10/01/2016 0935   BILITOT 0.60 10/01/2016 0935         Impression and Plan: Ms. Imm is a 71 year old white female. Looks a she has localized colon cancer. This is adenocarcinoma of the colon.  She is profoundly iron deficient. This is improving. Her MCV is coming up. Her hemoglobin is improved by 4 and half points.  I will give her a second dose of iron. I suspect her blood count should continue to improve. She is not actively bleeding so she should be able to absorb the iron and further increase her hemoglobin.  I will go ahead and plan to get her back in 2 more weeks. Hopefully, she will know the dates for her surgery.  I spent about 25 minutes with her.   Volanda Napoleon, MD 1/26/201810:59 AM

## 2016-10-02 LAB — RETICULOCYTES: RETICULOCYTE COUNT: 1.1 % (ref 0.6–2.6)

## 2016-10-06 ENCOUNTER — Encounter: Payer: Medicare Other | Admitting: Cardiothoracic Surgery

## 2016-10-06 ENCOUNTER — Encounter: Payer: Self-pay | Admitting: Cardiothoracic Surgery

## 2016-10-06 ENCOUNTER — Other Ambulatory Visit (HOSPITAL_COMMUNITY): Payer: Medicare Other

## 2016-10-06 ENCOUNTER — Institutional Professional Consult (permissible substitution) (INDEPENDENT_AMBULATORY_CARE_PROVIDER_SITE_OTHER): Payer: Medicare Other | Admitting: Cardiothoracic Surgery

## 2016-10-06 VITALS — BP 177/73 | HR 60 | Resp 20 | Ht 64.0 in | Wt 142.0 lb

## 2016-10-06 DIAGNOSIS — C189 Malignant neoplasm of colon, unspecified: Secondary | ICD-10-CM

## 2016-10-06 DIAGNOSIS — I2510 Atherosclerotic heart disease of native coronary artery without angina pectoris: Secondary | ICD-10-CM | POA: Diagnosis not present

## 2016-10-06 NOTE — Progress Notes (Signed)
PCP is [name], PA-C Referring Provider is Belva Crome, MD  Chief Complaint  Patient presents with  . Coronary Artery Disease    Surgical eval, Cardiac Cath 09/30/16,  ECHO 09/30/16   Patient examined, cardiac catheterization and echocardiogram personally reviewed  HPI: 71 year old Caucasian female diabetic on insulin pump recently diagnosed with severe multivessel CAD and preserved LV function. She was recently diagnosed with biopsy-proven adenocarcinoma of the cecum. She was scheduled for surgery at [facility]. Preoperative cardiac clearance was requested because of a family history of CAD and her diabetes. Stress test was positive for ischemia . Echocardiogram showed normally function with mild aortic sclerosis but no stenosis or insufficiency from a thickened valve  The scanning for staging of her colon cancer is been performed at [facility] but according to her gastroenterologist it appears to be stage A or B, not involving the liver. The patient has known fatty liver changes from her diabetes but normal LFTs.  The patient has received 2 units of packed cells and 2 doses of IV iron and her last hemoglobin is greater than 10.  Upper endoscopy at the time of her colonoscopy demonstrated gastric erosions and she is currently on Prilosec for the past 4 weeks. Endoscopy, upper and lower, was performed on December 20.  The patient uses a sliding scale insulin pump according to her meal intake. Her last A1c was 7.1. She also takes Glucophage 500 twice a day  Cardiac catheterization demonstrated severe proximal-ostial circumflex stenosis as well as distal left main and proximal LAD stenosis involving the ramus intermediate. LVEDP was normal.  The patient has bilateral varicose veins fairly severe below each knee and will need vein mapping prior to CABG  The patient and family understand that she will need to wait probably 3 months after CABG before she is ready for  hemicolectomy. They state that her gastroenterologist feels that her disease will be stable for that period time and that if CABG is recommended as her best long-term treatment of her CAD then that should be performed as soon as possible. The patient's cardiologist feels that CABG would be her best therapy for the anatomy of her CAD. CABG would also avoid the problems associated with strong antiplatelet therapy at the time of colectomy. Past Medical History:  Diagnosis Date  . Diabetes mellitus without complication ([place])   . Hyperlipidemia   . Hypertension   . Iron deficiency anemia due to chronic blood loss 09/17/2016  . 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 ABDOMINAL HYSTERECTOMY    . CARDIAC CATHETERIZATION N/A 09/30/2016   Procedure: Left Heart Cath and Coronary Angiography;  Surgeon: Belva Crome, MD;  Location: [facility];  Service: Cardiovascular;  Laterality: N/A;  . CHOLECYSTECTOMY      Family History  Problem Relation Age of Onset  . Heart attack Mother   . Diabetes Mother   . Heart attack Father   . Diabetes Father     Social History Social History  Substance Use Topics  . Smoking status: Never Smoker  . Smokeless tobacco: Never Used  . Alcohol use No    Current Outpatient Prescriptions  Medication Sig Dispense Refill  . Acetaminophen (TYLENOL 8 HOUR ARTHRITIS PAIN PO) Take 1 tablet by mouth every 8 (eight) hours as needed (arthritis).    Marland Kitchen amLODipine (NORVASC) 5 MG tablet Take one tablet (5 mg total) by mouth daily. 90 tablet 0  . atorvastatin (LIPITOR)  20 MG tablet Take 10 mg by mouth daily.    . Calcium Carbonate-Vitamin D (CALTRATE 600+D PO) Take 1 tablet by mouth daily.     . cloNIDine (CATAPRES) 0.2 MG tablet Take 0.2 mg by mouth 2 (two) times daily.    . Insulin Human (INSULIN PUMP) SOLN Inject 1.5-5.5 each into the skin 3 (three) times daily. humalog    . metFORMIN (GLUCOPHAGE-XR) 500 MG 24 hr tablet  Take two tablets (1,000 mg total) by mouth 2 (two) times daily with meals.  5  . omeprazole (PRILOSEC) 40 MG capsule Take 40 mg by mouth 2 (two) times daily.    . valsartan (DIOVAN) 160 MG tablet Take one tablet (160 mg total) by mouth daily. 90 tablet 0  . Vitamin D, Cholecalciferol, 400 units CAPS Take 800 Units by mouth daily.     Vanessa Kick Ethyl 1 g CAPS Take 2 g by mouth 2 (two) times daily. 120 capsule 4   Current Facility-Administered Medications  Medication Dose Route Frequency Provider Last Rate Last Dose  . dextrose 5 %-0.9 % sodium chloride infusion   Intravenous Continuous Belva Crome, MD        Allergies  Allergen Reactions  . Hydrochlorothiazide     Other reaction(s): Other HYPERCALCEMIA  . Losartan Potassium     Other reaction(s): Other Increased eosinophils    Review of Systems         Review of Systems :  [ y ] = yes, [  ] = no        General :  Weight gain [   ]    Weight loss  [ 5 pounds  ]  Fatigue [  ]  Fever [  ]  Chills  [  ]                                Weakness  [  ]           HEENT    Headache [  ]  Dizziness [  ]  Blurred vision [  ] Glaucoma  [  ]                          Nosebleeds [  ] Painful or loose teeth [  ]        Cardiac :  Chest pain/ pressure [  ]  Resting SOB [  ] exertional SOB [place].Blacker  ]                        Orthopnea [  ]  Pedal edema  [  ]  Palpitations [  ] Syncope/presyncope [ ]                         Paroxysmal nocturnal dyspnea [  ]         Pulmonary : cough [  ]  wheezing [  ]  Hemoptysis [  ] Sputum [  ] Snoring [  ]                              Pneumothorax [  ]  Sleep apnea [  ]        GI : Vomiting [  ]  Dysphagia [  ]  Melena  [  mild  ]  Abdominal pain [  ] BRBPR [  ]              Heart burn [place]er  ]  Constipation [  ] Diarrhea  [  ] Colonoscopy [   ]        GU : Hematuria [  ]  Dysuria [  ]  Nocturia [  ] UTI's [  ]        Vascular : Claudication [  ]  Rest pain [  ]  DVT [  ] Vein stripping [  ] leg ulcers [  ]                           TIA [  ] Stroke [  ]  Varicose veins [yes bilateral  ]        NEURO :  Headaches  [  ] Seizures [  ] Vision changes [  ] Paresthesias [  ]                                       Seizures [  ] Right-hand dominant       Musculoskeletal :  Arthritis [  ] Gout  [  ]  Back pain [  ]  Joint pain [  ]        Skin :  Rash [  ]  Melanoma [  ] Sores [  ]        Heme : Bleeding problems [  ]Clotting Disorders [  ] Anemia [  ]Blood Transfusion [ ]         Endocrine : Diabetes [ yes on insulin pump ] Heat or Cold intolerance [  ] Polyuria [  ]excessive thirst [ ]         Psych : Depression [  ]  Anxiety [  ]  Psych hospitalizations [  ] Memory change [  ]                                               BP (!) 177/73   Pulse 60   Resp 20   Ht 5\' 4"  (1.626 m)   Wt 142 lb (64.4 kg)   BMI 24.37 kg/m  Physical Exam     Physical Exam  General: Well nourished middle-aged Caucasian female no acute distress accompanied by family HEENT: Normocephalic pupils equal , dentition adequate Neck: Supple without JVD, adenopathy, or bruit Chest: Clear to auscultation, symmetrical breath sounds, no rhonchi, no tenderness             or deformity Cardiovascular: Regular rate and rhythm, soft 1/6 aortic sclerosis murmur, no gallop, peripheral pulses             palpable in all extremities Abdomen:  Soft, nontender, no palpable mass or organomegaly Extremities: Warm, well-perfused, no clubbing cyanosis edema or tenderness, significant posterior varicose veins of each lower leg below the knees but without               venous stasis dermatitis changes of the legs Rectal/GU: Deferred Neuro: Grossly non--focal and symmetrical throughout Skin: Clean and dry without rash or ulceration   Diagnostic Tests: Coronary arteriograms personally reviewed  and counseled with patient and family Patient would need left IMA bypass graft to LAD, possible radial artery graft to circumflex and saphenous vein  graft to ramus intermediate.  Impression: I discussed the procedure of CABG in detail the patient and family including indications benefits alternatives and risks. They wish to have surgery done as soon as possible in order for adequate recovery prior to colectomy to treat biopsy-proven adenocarcinoma the right colon  Plan: Surgery scheduled for February 5 at [facility]. Preoperative assessment will include vein mapping pre-CABG Dopplers and PFTs   Len Childs, MD [facility] [phone]

## 2016-10-07 ENCOUNTER — Other Ambulatory Visit: Payer: Self-pay | Admitting: *Deleted

## 2016-10-07 DIAGNOSIS — I2510 Atherosclerotic heart disease of native coronary artery without angina pectoris: Secondary | ICD-10-CM

## 2016-10-08 ENCOUNTER — Encounter (HOSPITAL_COMMUNITY)
Admission: RE | Admit: 2016-10-08 | Discharge: 2016-10-08 | Disposition: A | Discharge status: Home / Self Care | Payer: Medicare Other | Source: Ambulatory Visit | Attending: Cardiothoracic Surgery | Admitting: Cardiothoracic Surgery

## 2016-10-08 ENCOUNTER — Encounter (HOSPITAL_COMMUNITY): Payer: Self-pay

## 2016-10-08 ENCOUNTER — Ambulatory Visit (HOSPITAL_BASED_OUTPATIENT_CLINIC_OR_DEPARTMENT_OTHER)
Admission: RE | Admit: 2016-10-08 | Discharge: 2016-10-08 | Disposition: A | Discharge status: Home / Self Care | Payer: Medicare Other | Source: Ambulatory Visit | Attending: Cardiothoracic Surgery | Admitting: Cardiothoracic Surgery

## 2016-10-08 ENCOUNTER — Ambulatory Visit (HOSPITAL_COMMUNITY)
Admission: RE | Admit: 2016-10-08 | Discharge: 2016-10-08 | Disposition: A | Discharge status: Home / Self Care | Payer: Medicare Other | Source: Ambulatory Visit | Attending: Cardiothoracic Surgery | Admitting: Cardiothoracic Surgery

## 2016-10-08 ENCOUNTER — Other Ambulatory Visit (HOSPITAL_COMMUNITY): Payer: Self-pay | Admitting: *Deleted

## 2016-10-08 DIAGNOSIS — I2510 Atherosclerotic heart disease of native coronary artery without angina pectoris: Secondary | ICD-10-CM | POA: Diagnosis not present

## 2016-10-08 DIAGNOSIS — Z01812 Encounter for preprocedural laboratory examination: Secondary | ICD-10-CM | POA: Insufficient documentation

## 2016-10-08 DIAGNOSIS — Z01810 Encounter for preprocedural cardiovascular examination: Secondary | ICD-10-CM | POA: Insufficient documentation

## 2016-10-08 HISTORY — DX: Personal history of urinary calculi: Z87.442

## 2016-10-08 HISTORY — DX: Atherosclerotic heart disease of native coronary artery without angina pectoris: I25.10

## 2016-10-08 HISTORY — DX: Nausea with vomiting, unspecified: R11.2

## 2016-10-08 HISTORY — DX: Other specified postprocedural states: Z98.890

## 2016-10-08 HISTORY — DX: Gastro-esophageal reflux disease without esophagitis: K21.9

## 2016-10-08 HISTORY — DX: Unspecified osteoarthritis, unspecified site: M19.90

## 2016-10-08 HISTORY — DX: Malignant (primary) neoplasm, unspecified: C80.1

## 2016-10-08 HISTORY — DX: Personal history of other medical treatment: Z92.89

## 2016-10-08 HISTORY — DX: Cardiac murmur, unspecified: R01.1

## 2016-10-08 LAB — COMPREHENSIVE METABOLIC PANEL
ALT: 33 U/L (ref 14–54)
AST: 29 U/L (ref 15–41)
Albumin: 3.8 g/dL (ref 3.5–5.0)
Alkaline Phosphatase: 128 U/L — ABNORMAL HIGH (ref 38–126)
Anion gap: 8 (ref 5–15)
BUN: 22 mg/dL — ABNORMAL HIGH (ref 6–20)
CO2: 19 mmol/L — ABNORMAL LOW (ref 22–32)
Calcium: 9.7 mg/dL (ref 8.9–10.3)
Chloride: 109 mmol/L (ref 101–111)
Creatinine, Ser: 1.11 mg/dL — ABNORMAL HIGH (ref 0.44–1.00)
GFR calc Af Amer: 57 mL/min — ABNORMAL LOW (ref >60)
GFR calc non Af Amer: 49 mL/min — ABNORMAL LOW (ref >60)
Glucose, Bld: 183 mg/dL — ABNORMAL HIGH (ref 65–99)
Potassium: 4.8 mmol/L (ref 3.5–5.1)
Sodium: 136 mmol/L (ref 135–145)
Total Bilirubin: 0.3 mg/dL (ref 0.3–1.2)
Total Protein: 6.4 g/dL — ABNORMAL LOW (ref 6.5–8.1)

## 2016-10-08 LAB — PULMONARY FUNCTION TEST
DL/VA % pred: 102 %
DL/VA: 4.92 ml/min/mmHg/L
DLCO cor % pred: 71 %
DLCO cor: 17.4 ml/min/mmHg
DLCO unc % pred: 65 %
DLCO unc: 15.83 ml/min/mmHg
FEF 25-75 Post: 0.81 L/sec
FEF 25-75 Pre: 0.7 L/sec
FEF2575-%Change-Post: 16 %
FEF2575-%Pred-Post: 44 %
FEF2575-%Pred-Pre: 38 %
FEV1-%Change-Post: 0 %
FEV1-%Pred-Post: 55 %
FEV1-%Pred-Pre: 56 %
FEV1-Post: 1.23 L
FEV1-Pre: 1.24 L
FEV1FVC-%Change-Post: 3 %
FEV1FVC-%Pred-Pre: 63 %
FEV6-%Change-Post: -3 %
FEV6-%Pred-Post: 87 %
FEV6-%Pred-Pre: 90 %
FEV6-Post: 2.46 L
FEV6-Pre: 2.55 L
FEV6FVC-%Pred-Post: 104 %
FEV6FVC-%Pred-Pre: 104 %
FVC-%Change-Post: -4 %
FVC-%Pred-Post: 84 %
FVC-%Pred-Pre: 87 %
FVC-Post: 2.46 L
FVC-Pre: 2.57 L
Post FEV1/FVC ratio: 50 %
Post FEV6/FVC ratio: 100 %
Pre FEV1/FVC ratio: 48 %
Pre FEV6/FVC Ratio: 100 %
RV % pred: 70 %
RV: 1.57 L
TLC % pred: 80 %
TLC: 4.09 L

## 2016-10-08 LAB — APTT: aPTT: 30 seconds (ref 24–36)

## 2016-10-08 LAB — CBC
HCT: 34.3 % — ABNORMAL LOW (ref 36.0–46.0)
Hemoglobin: 10.3 g/dL — ABNORMAL LOW (ref 12.0–15.0)
MCH: 25.4 pg — ABNORMAL LOW (ref 26.0–34.0)
MCHC: 30 g/dL (ref 30.0–36.0)
MCV: 84.7 fL (ref 78.0–100.0)
Platelets: 188 10*3/uL (ref 150–400)
RBC: 4.05 MIL/uL (ref 3.87–5.11)
RDW: 27 % — ABNORMAL HIGH (ref 11.5–15.5)
WBC: 4 10*3/uL (ref 4.0–10.5)

## 2016-10-08 LAB — SURGICAL PCR SCREEN
MRSA, PCR: NEGATIVE
Staphylococcus aureus: POSITIVE — AB

## 2016-10-08 LAB — URINALYSIS, ROUTINE W REFLEX MICROSCOPIC
Bilirubin Urine: NEGATIVE
Glucose, UA: NEGATIVE mg/dL
Hgb urine dipstick: NEGATIVE
Ketones, ur: NEGATIVE mg/dL
Nitrite: NEGATIVE
Protein, ur: NEGATIVE mg/dL
Specific Gravity, Urine: 1.004 — ABNORMAL LOW (ref 1.005–1.030)
pH: 6 (ref 5.0–8.0)

## 2016-10-08 LAB — ABO/RH: ABO/RH(D): O POS

## 2016-10-08 LAB — PROTIME-INR
INR: 1.1
Prothrombin Time: 14.2 seconds (ref 11.4–15.2)

## 2016-10-08 LAB — GLUCOSE, CAPILLARY: Glucose-Capillary: 167 mg/dL — ABNORMAL HIGH (ref 65–99)

## 2016-10-08 MED ORDER — ALBUTEROL SULFATE (2.5 MG/3ML) 0.083% IN NEBU
2.5000 mg | INHALATION_SOLUTION | Freq: Once | RESPIRATORY_TRACT | Status: AC
  Administered 2016-10-08: 2.5 mg via RESPIRATORY_TRACT

## 2016-10-08 NOTE — Pre-Procedure Instructions (Signed)
[name]  10/08/2016    Your procedure is scheduled on Monday, October 11, 2016 at 7:30 AM.   Report to [facility] Entrance "A" Admitting Office at 5:30 AM.   Call this number if you have problems the morning of surgery: [phone]   Remember:  Do not eat food or drink liquids after midnight Sunday, 10/10/16.   Take these medicines the morning of surgery with A SIP OF WATER: Amlodipine (Norvasc), Clonidine (Catapres), Omeprazole (Prilosec)   How to Manage Your Diabetes Before Surgery   Why is it important to control my blood sugar before and after surgery?   Improving blood sugar levels before and after surgery helps healing and can limit problems.  A way of improving blood sugar control is eating a healthy diet by:  - Eating less sugar and carbohydrates  - Increasing activity/exercise  - Talk with your doctor about reaching your blood sugar goals  High blood sugars (greater than 180 mg/dL) can raise your risk of infections and slow down your recovery so you will need to focus on controlling your diabetes during the weeks before surgery.  Make sure that the doctor who takes care of your diabetes knows about your planned surgery including the date and location.  How do I manage my blood sugars before surgery?   Check your blood sugar at least 4 times a day, 2 days before surgery to make sure that they are not too high or low.  Check your blood sugar the morning of your surgery when you wake up and every 2 hours until you get to the Short-Stay unit.  Treat a low blood sugar (less than 70 mg/dL) with 1/2 cup of clear juice (cranberry or apple), 4 glucose tablets, OR glucose gel.  Recheck blood sugar in 15 minutes after treatment (to make sure it is greater than 70 mg/dL).  If blood sugar is not greater than 70 mg/dL on re-check, call [phone] for further instructions.   Report your blood sugar to the Short-Stay nurse when you get to  Short-Stay.  References:  [facility], 2007 "How to Manage your Diabetes Before and After Surgery".  What do I do about my diabetes medications?   Do not take oral diabetes medicines (pills) the morning of surgery.   For patients with "Insulin Pumps":  Contact your diabetes doctor for specific instructions before surgery.   Decrease basal insulin rates by 20% at midnight the night before surgery (or follow instructions by your diabetes doctor)  Note that if your surgery is planned to be longer than 2 hours, your insulin pump will be removed and intravenous (IV) insulin will be started and managed by the nurses and anesthesiologist.  You will be able to restart your insulin pump once you are awake and able to manage it.  Make sure to bring insulin pump supplies to the hospital with you in case your site needs to be changed.    Do not wear jewelry, make-up or nail polish.  Do not wear lotions, powders, perfumes or deodorant.  Do not shave 48 hours prior to surgery.    Do not bring valuables to the hospital.  [facility] is not responsible for any belongings or valuables.  Contacts, dentures or bridgework may not be worn into surgery.  Leave your suitcase in the car.  After surgery it may be brought to your room.  For patients admitted to the hospital, discharge time will be determined by your treatment  team.  Special instructions:   - Preparing for Surgery  Before surgery, you can play an important role.  Because skin is not sterile, your skin needs to be as free of germs as possible.  You can reduce the number of germs on you skin by washing with CHG (chlorahexidine gluconate) soap before surgery.  CHG is an antiseptic cleaner which kills germs and bonds with the skin to continue killing germs even after washing.  Please DO NOT use if you have an allergy to CHG or antibacterial soaps.  If your skin becomes reddened/irritated stop using the CHG  and inform your nurse when you arrive at Short Stay.  Do not shave (including legs and underarms) for at least 48 hours prior to the first CHG shower.  You may shave your face.  Please follow these instructions carefully:   1.  Shower with CHG Soap the night before surgery and the                    morning of Surgery.  2.  If you choose to wash your hair, wash your hair first as usual with your       normal shampoo.  3.  After you shampoo, rinse your hair and body thoroughly to remove the shampoo.  4.  Use CHG as you would any other liquid soap.  You can apply chg directly       to the skin and wash gently with scrungie or a clean washcloth.  5.  Apply the CHG Soap to your body ONLY FROM THE NECK DOWN.        Do not use on open wounds or open sores.  Avoid contact with your eyes, ears, mouth and genitals (private parts).  Wash genitals (private parts) with your normal soap.  6.  Wash thoroughly, paying special attention to the area where your surgery        will be performed.  7.  Thoroughly rinse your body with warm water from the neck down.  8.  DO NOT shower/wash with your normal soap after using and rinsing off       the CHG Soap.  9.  Pat yourself dry with a clean towel.            10.  Wear clean pajamas.            11.  Place clean sheets on your bed the night of your first shower and do not        sleep with pets.  Day of Surgery  Do not apply any lotions/deodorants the morning of surgery.  Please wear clean clothes to the hospital.   Please read over the fact sheets that you were given.

## 2016-10-08 NOTE — Progress Notes (Signed)
Pre-op Cardiac Surgery  Carotid Findings:  Performed 09/28/15  Upper Extremity Right Left  Brachial Pressures 131 134  Radial Waveforms Tri Tri  Ulnar Waveforms Tri Tri  [place] Arch (Allen's Test) Normal  Decreases >50% with radial compression, normal with ulnar compression   Findings:  Pedal artery waveforms within normal limits.   Landry Mellow, RDMS, RVT 10/08/2016

## 2016-10-08 NOTE — Progress Notes (Signed)
Pt denies any chest pain or sob. Pt is diabetic, last A1C was 7.4 on 07/19/17. States fasting blood sugar is usually around 90.  Pt has an insulin pump. Instructed pt to reduce the basal rate by 20% at midnight prior to surgery. She states she usually has to call "the nurse" to help her with her insulin pump and was concerned about having to call her at midnight. I suggested to her to call the nurse earlier in the evening so she could write down the steps and then do it at midnight. She agreed that would probably work. Pt was informed that the insulin pump would be taken off during surgery and she would be on IV Insulin. She voiced understanding. Pt states Dr. Prescott Gum instructed her to stop Metformin as of today. Almyra Free, Diabetic coordinator was notified earlier today that pt will be coming in on Monday, 10/11/16 for CABG.

## 2016-10-08 NOTE — Progress Notes (Signed)
Lower Extremity Vein Map    Right Great Saphenous Vein   Segment Diameter Comment  1. Origin 4.66mm   2. High Thigh 3.24mm   3. Mid Thigh 3.59mm Branch  4. Low Thigh 2.72mm   5. At Knee 2.10mm Branch   6. High Calf 1.50mm Multiple branch  7. Low Calf 2.3mm Multiple branch  8. Ankle mm    mm    mm    mm    Left Great Saphenous Vein  Segment Diameter Comment  1. Origin 5.85mm   2. High Thigh 2.40mm   3. Mid Thigh 2.65mm Multiple branch  4. Low Thigh 2.19mm   5. At Knee 3.54mm Multiple branch  6. High Calf 2.53mm   7. Low Calf 2.17mm Branch   8. Ankle 2.27mm    mm    mm    mm    Landry Mellow, RDMS, RVT 10/08/2016

## 2016-10-09 LAB — HEMOGLOBIN A1C
Hgb A1c MFr Bld: 5.6 % (ref 4.8–5.6)
Mean Plasma Glucose: 114 mg/dL

## 2016-10-10 MED ORDER — SODIUM CHLORIDE 0.9 % IV SOLN
INTRAVENOUS | Status: AC
  Administered 2016-10-11: 3.2 [IU]/h via INTRAVENOUS
  Filled 2016-10-10: qty 2.5

## 2016-10-10 MED ORDER — MAGNESIUM SULFATE 50 % IJ SOLN
40.0000 meq | INTRAMUSCULAR | Status: DC
  Filled 2016-10-10: qty 10

## 2016-10-10 MED ORDER — TRANEXAMIC ACID (OHS) BOLUS VIA INFUSION
15.0000 mg/kg | INTRAVENOUS | Status: AC
  Administered 2016-10-11: 987 mg via INTRAVENOUS
  Filled 2016-10-10: qty 987

## 2016-10-10 MED ORDER — METOPROLOL TARTRATE 12.5 MG HALF TABLET
12.5000 mg | ORAL_TABLET | Freq: Once | ORAL | Status: DC
Start: 2016-10-11 — End: 2016-10-11

## 2016-10-10 MED ORDER — DEXTROSE 5 % IV SOLN
1.5000 g | INTRAVENOUS | Status: AC
  Administered 2016-10-11: 1.5 g via INTRAVENOUS
  Administered 2016-10-11: .75 g via INTRAVENOUS
  Filled 2016-10-10: qty 1.5

## 2016-10-10 MED ORDER — SODIUM CHLORIDE 0.9 % IV SOLN
1250.0000 mg | INTRAVENOUS | Status: AC
  Administered 2016-10-11: 1250 mg via INTRAVENOUS
  Filled 2016-10-10: qty 1250

## 2016-10-10 MED ORDER — NITROGLYCERIN IN D5W 200-5 MCG/ML-% IV SOLN
2.0000 ug/min | INTRAVENOUS | Status: AC
  Administered 2016-10-11: 5 ug/min via INTRAVENOUS
  Filled 2016-10-10: qty 250

## 2016-10-10 MED ORDER — CHLORHEXIDINE GLUCONATE 0.12 % MT SOLN
15.0000 mL | Freq: Once | OROMUCOSAL | Status: AC
  Administered 2016-10-11: 15 mL via OROMUCOSAL
  Filled 2016-10-10: qty 15

## 2016-10-10 MED ORDER — DOPAMINE-DEXTROSE 3.2-5 MG/ML-% IV SOLN
0.0000 ug/kg/min | INTRAVENOUS | Status: AC
  Administered 2016-10-11: 2.5 ug/kg/min via INTRAVENOUS
  Filled 2016-10-10: qty 250

## 2016-10-10 MED ORDER — EPINEPHRINE PF 1 MG/ML IJ SOLN
0.0000 ug/min | INTRAVENOUS | Status: DC
  Filled 2016-10-10: qty 4

## 2016-10-10 MED ORDER — POTASSIUM CHLORIDE 2 MEQ/ML IV SOLN
80.0000 meq | INTRAVENOUS | Status: DC
  Filled 2016-10-10: qty 40

## 2016-10-10 MED ORDER — TRANEXAMIC ACID (OHS) PUMP PRIME SOLUTION
2.0000 mg/kg | INTRAVENOUS | Status: DC
  Filled 2016-10-10: qty 1.32

## 2016-10-10 MED ORDER — DEXTROSE 5 % IV SOLN
750.0000 mg | INTRAVENOUS | Status: DC
  Filled 2016-10-10: qty 750

## 2016-10-10 MED ORDER — DEXMEDETOMIDINE HCL IN NACL 400 MCG/100ML IV SOLN
0.1000 ug/kg/h | INTRAVENOUS | Status: AC
  Administered 2016-10-11: .3 ug/kg/h via INTRAVENOUS
  Filled 2016-10-10: qty 100

## 2016-10-10 MED ORDER — SODIUM CHLORIDE 0.9 % IV SOLN
INTRAVENOUS | Status: DC
  Filled 2016-10-10: qty 30

## 2016-10-10 MED ORDER — SODIUM CHLORIDE 0.9 % IV SOLN
30.0000 ug/min | INTRAVENOUS | Status: DC
  Filled 2016-10-10: qty 2

## 2016-10-10 MED ORDER — PLASMA-LYTE 148 IV SOLN
INTRAVENOUS | Status: AC
  Administered 2016-10-11: 450 mL
  Filled 2016-10-10: qty 2.5

## 2016-10-10 MED ORDER — TRANEXAMIC ACID 1000 MG/10ML IV SOLN
1.5000 mg/kg/h | INTRAVENOUS | Status: AC
  Administered 2016-10-11: 1.5 mg/kg/h via INTRAVENOUS
  Filled 2016-10-10: qty 25

## 2016-10-11 ENCOUNTER — Encounter (HOSPITAL_COMMUNITY): Payer: Self-pay | Admitting: Anesthesiology

## 2016-10-11 ENCOUNTER — Inpatient Hospital Stay (HOSPITAL_COMMUNITY): Payer: Medicare Other | Admitting: Anesthesiology

## 2016-10-11 ENCOUNTER — Inpatient Hospital Stay (HOSPITAL_COMMUNITY): Payer: Medicare Other

## 2016-10-11 ENCOUNTER — Encounter (HOSPITAL_COMMUNITY)
Admission: RE | Disposition: A | Discharge status: Home / Self Care | Payer: Self-pay | Source: Ambulatory Visit | Attending: Cardiothoracic Surgery

## 2016-10-11 ENCOUNTER — Inpatient Hospital Stay (HOSPITAL_COMMUNITY)
Admission: RE | Admit: 2016-10-11 | Discharge: 2016-10-19 | DRG: 236 | Disposition: A | Discharge status: Home / Self Care | Payer: Medicare Other | Source: Ambulatory Visit | Attending: Cardiothoracic Surgery | Admitting: Cardiothoracic Surgery

## 2016-10-11 DIAGNOSIS — I10 Essential (primary) hypertension: Secondary | ICD-10-CM | POA: Diagnosis present

## 2016-10-11 DIAGNOSIS — J9811 Atelectasis: Secondary | ICD-10-CM

## 2016-10-11 DIAGNOSIS — I481 Persistent atrial fibrillation: Secondary | ICD-10-CM | POA: Diagnosis not present

## 2016-10-11 DIAGNOSIS — E1122 Type 2 diabetes mellitus with diabetic chronic kidney disease: Secondary | ICD-10-CM | POA: Diagnosis present

## 2016-10-11 DIAGNOSIS — N183 Chronic kidney disease, stage 3 (moderate): Secondary | ICD-10-CM | POA: Diagnosis present

## 2016-10-11 DIAGNOSIS — K219 Gastro-esophageal reflux disease without esophagitis: Secondary | ICD-10-CM | POA: Diagnosis present

## 2016-10-11 DIAGNOSIS — Z8249 Family history of ischemic heart disease and other diseases of the circulatory system: Secondary | ICD-10-CM

## 2016-10-11 DIAGNOSIS — I2510 Atherosclerotic heart disease of native coronary artery without angina pectoris: Principal | ICD-10-CM | POA: Diagnosis present

## 2016-10-11 DIAGNOSIS — I959 Hypotension, unspecified: Secondary | ICD-10-CM | POA: Diagnosis not present

## 2016-10-11 DIAGNOSIS — C180 Malignant neoplasm of cecum: Secondary | ICD-10-CM | POA: Diagnosis present

## 2016-10-11 DIAGNOSIS — Z951 Presence of aortocoronary bypass graft: Secondary | ICD-10-CM

## 2016-10-11 DIAGNOSIS — D62 Acute posthemorrhagic anemia: Secondary | ICD-10-CM | POA: Diagnosis not present

## 2016-10-11 DIAGNOSIS — E11649 Type 2 diabetes mellitus with hypoglycemia without coma: Secondary | ICD-10-CM | POA: Diagnosis present

## 2016-10-11 DIAGNOSIS — Z9641 Presence of insulin pump (external) (internal): Secondary | ICD-10-CM | POA: Diagnosis present

## 2016-10-11 DIAGNOSIS — I480 Paroxysmal atrial fibrillation: Secondary | ICD-10-CM

## 2016-10-11 DIAGNOSIS — I700 Atherosclerosis of aorta: Secondary | ICD-10-CM | POA: Diagnosis present

## 2016-10-11 DIAGNOSIS — E782 Mixed hyperlipidemia: Secondary | ICD-10-CM | POA: Diagnosis present

## 2016-10-11 DIAGNOSIS — R110 Nausea: Secondary | ICD-10-CM | POA: Diagnosis not present

## 2016-10-11 DIAGNOSIS — M1990 Unspecified osteoarthritis, unspecified site: Secondary | ICD-10-CM | POA: Diagnosis present

## 2016-10-11 DIAGNOSIS — I495 Sick sinus syndrome: Secondary | ICD-10-CM | POA: Diagnosis not present

## 2016-10-11 HISTORY — PX: CORONARY ARTERY BYPASS GRAFT: SHX141

## 2016-10-11 HISTORY — PX: TEE WITHOUT CARDIOVERSION: SHX5443

## 2016-10-11 LAB — POCT I-STAT, CHEM 8
BUN: 22 mg/dL — ABNORMAL HIGH (ref 6–20)
BUN: 17 mg/dL (ref 6–20)
BUN: 20 mg/dL (ref 6–20)
BUN: 17 mg/dL (ref 6–20)
BUN: 16 mg/dL (ref 6–20)
BUN: 18 mg/dL (ref 6–20)
BUN: 14 mg/dL (ref 6–20)
Calcium, Ion: 1.31 mmol/L (ref 1.15–1.40)
Calcium, Ion: 0.93 mmol/L — ABNORMAL LOW (ref 1.15–1.40)
Calcium, Ion: 1.33 mmol/L (ref 1.15–1.40)
Calcium, Ion: 1.02 mmol/L — ABNORMAL LOW (ref 1.15–1.40)
Calcium, Ion: 0.9 mmol/L — ABNORMAL LOW (ref 1.15–1.40)
Calcium, Ion: 0.99 mmol/L — ABNORMAL LOW (ref 1.15–1.40)
Calcium, Ion: 1.16 mmol/L (ref 1.15–1.40)
Chloride: 107 mmol/L (ref 101–111)
Chloride: 102 mmol/L (ref 101–111)
Chloride: 107 mmol/L (ref 101–111)
Chloride: 99 mmol/L — ABNORMAL LOW (ref 101–111)
Chloride: 100 mmol/L — ABNORMAL LOW (ref 101–111)
Chloride: 100 mmol/L — ABNORMAL LOW (ref 101–111)
Chloride: 106 mmol/L (ref 101–111)
Creatinine, Ser: 0.9 mg/dL (ref 0.44–1.00)
Creatinine, Ser: 0.5 mg/dL (ref 0.44–1.00)
Creatinine, Ser: 0.8 mg/dL (ref 0.44–1.00)
Creatinine, Ser: 0.5 mg/dL (ref 0.44–1.00)
Creatinine, Ser: 0.6 mg/dL (ref 0.44–1.00)
Creatinine, Ser: 0.7 mg/dL (ref 0.44–1.00)
Creatinine, Ser: 0.8 mg/dL (ref 0.44–1.00)
Glucose, Bld: 220 mg/dL — ABNORMAL HIGH (ref 65–99)
Glucose, Bld: 174 mg/dL — ABNORMAL HIGH (ref 65–99)
Glucose, Bld: 175 mg/dL — ABNORMAL HIGH (ref 65–99)
Glucose, Bld: 135 mg/dL — ABNORMAL HIGH (ref 65–99)
Glucose, Bld: 126 mg/dL — ABNORMAL HIGH (ref 65–99)
Glucose, Bld: 160 mg/dL — ABNORMAL HIGH (ref 65–99)
Glucose, Bld: 132 mg/dL — ABNORMAL HIGH (ref 65–99)
HCT: 27 % — ABNORMAL LOW (ref 36.0–46.0)
HCT: 26 % — ABNORMAL LOW (ref 36.0–46.0)
HCT: 26 % — ABNORMAL LOW (ref 36.0–46.0)
HCT: 26 % — ABNORMAL LOW (ref 36.0–46.0)
HCT: 24 % — ABNORMAL LOW (ref 36.0–46.0)
HCT: 25 % — ABNORMAL LOW (ref 36.0–46.0)
HCT: 28 % — ABNORMAL LOW (ref 36.0–46.0)
Hemoglobin: 9.2 g/dL — ABNORMAL LOW (ref 12.0–15.0)
Hemoglobin: 8.8 g/dL — ABNORMAL LOW (ref 12.0–15.0)
Hemoglobin: 8.8 g/dL — ABNORMAL LOW (ref 12.0–15.0)
Hemoglobin: 8.8 g/dL — ABNORMAL LOW (ref 12.0–15.0)
Hemoglobin: 8.2 g/dL — ABNORMAL LOW (ref 12.0–15.0)
Hemoglobin: 8.5 g/dL — ABNORMAL LOW (ref 12.0–15.0)
Hemoglobin: 9.5 g/dL — ABNORMAL LOW (ref 12.0–15.0)
Potassium: 4.5 mmol/L (ref 3.5–5.1)
Potassium: 4.2 mmol/L (ref 3.5–5.1)
Potassium: 4.9 mmol/L (ref 3.5–5.1)
Potassium: 5.7 mmol/L — ABNORMAL HIGH (ref 3.5–5.1)
Potassium: 5.1 mmol/L (ref 3.5–5.1)
Potassium: 5.7 mmol/L — ABNORMAL HIGH (ref 3.5–5.1)
Potassium: 3.8 mmol/L (ref 3.5–5.1)
Sodium: 139 mmol/L (ref 135–145)
Sodium: 140 mmol/L (ref 135–145)
Sodium: 140 mmol/L (ref 135–145)
Sodium: 133 mmol/L — ABNORMAL LOW (ref 135–145)
Sodium: 135 mmol/L (ref 135–145)
Sodium: 136 mmol/L (ref 135–145)
Sodium: 141 mmol/L (ref 135–145)
TCO2: 24 mmol/L (ref 0–100)
TCO2: 24 mmol/L (ref 0–100)
TCO2: 25 mmol/L (ref 0–100)
TCO2: 26 mmol/L (ref 0–100)
TCO2: 23 mmol/L (ref 0–100)
TCO2: 28 mmol/L (ref 0–100)
TCO2: 26 mmol/L (ref 0–100)

## 2016-10-11 LAB — POCT I-STAT 3, ART BLOOD GAS (G3+)
Acid-base deficit: 2 mmol/L (ref 0.0–2.0)
Acid-base deficit: 1 mmol/L (ref 0.0–2.0)
Acid-base deficit: 2 mmol/L (ref 0.0–2.0)
Acid-base deficit: 3 mmol/L — ABNORMAL HIGH (ref 0.0–2.0)
Acid-base deficit: 1 mmol/L (ref 0.0–2.0)
Bicarbonate: 22.5 mmol/L (ref 20.0–28.0)
Bicarbonate: 24.3 mmol/L (ref 20.0–28.0)
Bicarbonate: 23.6 mmol/L (ref 20.0–28.0)
Bicarbonate: 22.2 mmol/L (ref 20.0–28.0)
Bicarbonate: 24.1 mmol/L (ref 20.0–28.0)
O2 Saturation: 100 %
O2 Saturation: 100 %
O2 Saturation: 99 %
O2 Saturation: 99 %
O2 Saturation: 99 %
Patient temperature: 35.7
Patient temperature: 37.6
Patient temperature: 37.9
TCO2: 24 mmol/L (ref 0–100)
TCO2: 26 mmol/L (ref 0–100)
TCO2: 25 mmol/L (ref 0–100)
TCO2: 23 mmol/L (ref 0–100)
TCO2: 25 mmol/L (ref 0–100)
pCO2 arterial: 38 mmHg (ref 32.0–48.0)
pCO2 arterial: 42.2 mmHg (ref 32.0–48.0)
pCO2 arterial: 40.6 mmHg (ref 32.0–48.0)
pCO2 arterial: 40.8 mmHg (ref 32.0–48.0)
pCO2 arterial: 42.2 mmHg (ref 32.0–48.0)
pH, Arterial: 7.38 (ref 7.350–7.450)
pH, Arterial: 7.368 (ref 7.350–7.450)
pH, Arterial: 7.367 (ref 7.350–7.450)
pH, Arterial: 7.346 — ABNORMAL LOW (ref 7.350–7.450)
pH, Arterial: 7.369 (ref 7.350–7.450)
pO2, Arterial: 285 mmHg — ABNORMAL HIGH (ref 83.0–108.0)
pO2, Arterial: 495 mmHg — ABNORMAL HIGH (ref 83.0–108.0)
pO2, Arterial: 141 mmHg — ABNORMAL HIGH (ref 83.0–108.0)
pO2, Arterial: 151 mmHg — ABNORMAL HIGH (ref 83.0–108.0)
pO2, Arterial: 165 mmHg — ABNORMAL HIGH (ref 83.0–108.0)

## 2016-10-11 LAB — GLUCOSE, CAPILLARY
Glucose-Capillary: 102 mg/dL — ABNORMAL HIGH (ref 65–99)
Glucose-Capillary: 112 mg/dL — ABNORMAL HIGH (ref 65–99)
Glucose-Capillary: 116 mg/dL — ABNORMAL HIGH (ref 65–99)
Glucose-Capillary: 123 mg/dL — ABNORMAL HIGH (ref 65–99)
Glucose-Capillary: 126 mg/dL — ABNORMAL HIGH (ref 65–99)
Glucose-Capillary: 137 mg/dL — ABNORMAL HIGH (ref 65–99)
Glucose-Capillary: 145 mg/dL — ABNORMAL HIGH (ref 65–99)
Glucose-Capillary: 160 mg/dL — ABNORMAL HIGH (ref 65–99)
Glucose-Capillary: 194 mg/dL — ABNORMAL HIGH (ref 65–99)

## 2016-10-11 LAB — BLOOD GAS, ARTERIAL
Acid-base deficit: 5.5 mmol/L — ABNORMAL HIGH (ref 0.0–2.0)
Bicarbonate: 18.7 mmol/L — ABNORMAL LOW (ref 20.0–28.0)
Drawn by: 470591
FIO2: 21
O2 Saturation: 97 %
Patient temperature: 98.6
pCO2 arterial: 32.6 mmHg (ref 32.0–48.0)
pH, Arterial: 7.377 (ref 7.350–7.450)
pO2, Arterial: 95.4 mmHg (ref 83.0–108.0)

## 2016-10-11 LAB — CBC
HCT: 30.5 % — ABNORMAL LOW (ref 36.0–46.0)
HCT: 29.8 % — ABNORMAL LOW (ref 36.0–46.0)
HEMOGLOBIN: 9.8 g/dL — AB (ref 12.0–15.0)
Hemoglobin: 9.5 g/dL — ABNORMAL LOW (ref 12.0–15.0)
MCH: 26.8 pg (ref 26.0–34.0)
MCH: 26.5 pg (ref 26.0–34.0)
MCHC: 32.1 g/dL (ref 30.0–36.0)
MCHC: 31.9 g/dL (ref 30.0–36.0)
MCV: 83.6 fL (ref 78.0–100.0)
MCV: 83.2 fL (ref 78.0–100.0)
Platelets: 135 10*3/uL — ABNORMAL LOW (ref 150–400)
Platelets: 129 10*3/uL — ABNORMAL LOW (ref 150–400)
RBC: 3.65 MIL/uL — AB (ref 3.87–5.11)
RBC: 3.58 MIL/uL — ABNORMAL LOW (ref 3.87–5.11)
RDW: 23.8 % — ABNORMAL HIGH (ref 11.5–15.5)
RDW: 24.1 % — ABNORMAL HIGH (ref 11.5–15.5)
WBC: 8.6 10*3/uL (ref 4.0–10.5)
WBC: 7 10*3/uL (ref 4.0–10.5)

## 2016-10-11 LAB — TYPE AND SCREEN
ABO/RH(D): O POS
ANTIBODY SCREEN: NEGATIVE

## 2016-10-11 LAB — MAGNESIUM: Magnesium: 2.6 mg/dL — ABNORMAL HIGH (ref 1.7–2.4)

## 2016-10-11 LAB — CREATININE, SERUM
Creatinine, Ser: 0.86 mg/dL (ref 0.44–1.00)
GFR calc Af Amer: >60 mL/min (ref >60)
GFR calc non Af Amer: >60 mL/min (ref >60)

## 2016-10-11 LAB — POCT I-STAT 4, (NA,K, GLUC, HGB,HCT)
Glucose, Bld: 184 mg/dL — ABNORMAL HIGH (ref 65–99)
HCT: 31 % — ABNORMAL LOW (ref 36.0–46.0)
Hemoglobin: 10.5 g/dL — ABNORMAL LOW (ref 12.0–15.0)
Potassium: 4.1 mmol/L (ref 3.5–5.1)
Sodium: 139 mmol/L (ref 135–145)

## 2016-10-11 LAB — PROTIME-INR
INR: 1.3
Prothrombin Time: 16.3 seconds — ABNORMAL HIGH (ref 11.4–15.2)

## 2016-10-11 LAB — HEMOGLOBIN AND HEMATOCRIT, BLOOD
HCT: 26.7 % — ABNORMAL LOW (ref 36.0–46.0)
Hemoglobin: 8.7 g/dL — ABNORMAL LOW (ref 12.0–15.0)

## 2016-10-11 LAB — PLATELET COUNT: Platelets: 120 10*3/uL — ABNORMAL LOW (ref 150–400)

## 2016-10-11 LAB — APTT: aPTT: 34 seconds (ref 24–36)

## 2016-10-11 LAB — PREPARE RBC (CROSSMATCH)

## 2016-10-11 SURGERY — CORONARY ARTERY BYPASS GRAFTING (CABG)
Anesthesia: General | Site: Esophagus

## 2016-10-11 MED ORDER — MIDAZOLAM HCL 5 MG/5ML IJ SOLN
INTRAMUSCULAR | Status: DC | PRN
  Administered 2016-10-11: 4 mg via INTRAVENOUS
  Administered 2016-10-11: 1 mg via INTRAVENOUS
  Administered 2016-10-11: 2 mg via INTRAVENOUS
  Administered 2016-10-11: 1 mg via INTRAVENOUS
  Administered 2016-10-11: 2 mg via INTRAVENOUS

## 2016-10-11 MED ORDER — PANTOPRAZOLE SODIUM 40 MG PO TBEC
40.0000 mg | DELAYED_RELEASE_TABLET | Freq: Every day | ORAL | Status: DC
  Administered 2016-10-13 – 2016-10-19 (×7): 40 mg via ORAL
  Filled 2016-10-11 (×7): qty 1

## 2016-10-11 MED ORDER — MIDAZOLAM HCL 10 MG/2ML IJ SOLN
INTRAMUSCULAR | Status: AC
  Filled 2016-10-11: qty 2

## 2016-10-11 MED ORDER — PANTOPRAZOLE SODIUM 40 MG PO TBEC: 80.0000 mg | DELAYED_RELEASE_TABLET | Freq: Two times a day (BID) | ORAL | Status: DC

## 2016-10-11 MED ORDER — MILRINONE LACTATE IN DEXTROSE 20-5 MG/100ML-% IV SOLN
0.1250 ug/kg/min | INTRAVENOUS | Status: DC
  Administered 2016-10-11: .3 ug/kg/min via INTRAVENOUS
  Filled 2016-10-11: qty 100

## 2016-10-11 MED ORDER — MILRINONE LACTATE IN DEXTROSE 20-5 MG/100ML-% IV SOLN
0.3000 ug/kg/min | INTRAVENOUS | Status: DC
  Administered 2016-10-11: 0.3 ug/kg/min via INTRAVENOUS
  Filled 2016-10-11: qty 100

## 2016-10-11 MED ORDER — MIDAZOLAM HCL 2 MG/2ML IJ SOLN: 2.0000 mg | INTRAMUSCULAR | Status: DC | PRN

## 2016-10-11 MED ORDER — PROTAMINE SULFATE 10 MG/ML IV SOLN
INTRAVENOUS | Status: DC | PRN
  Administered 2016-10-11: 200 mg via INTRAVENOUS

## 2016-10-11 MED ORDER — SODIUM CHLORIDE 0.9 % IV SOLN
INTRAVENOUS | Status: DC | PRN
  Administered 2016-10-11 (×2): 25 ug/min via INTRAVENOUS

## 2016-10-11 MED ORDER — 0.9 % SODIUM CHLORIDE (POUR BTL) OPTIME
TOPICAL | Status: DC | PRN
  Administered 2016-10-11: 4000 mL

## 2016-10-11 MED ORDER — METOPROLOL TARTRATE 25 MG/10 ML ORAL SUSPENSION: 12.5000 mg | Freq: Two times a day (BID) | ORAL | Status: DC

## 2016-10-11 MED ORDER — PROPOFOL 10 MG/ML IV BOLUS
INTRAVENOUS | Status: AC
  Filled 2016-10-11: qty 40

## 2016-10-11 MED ORDER — DOPAMINE-DEXTROSE 3.2-5 MG/ML-% IV SOLN
0.0000 ug/kg/min | INTRAVENOUS | Status: DC
  Filled 2016-10-11: qty 250

## 2016-10-11 MED ORDER — VANCOMYCIN HCL IN DEXTROSE 1-5 GM/200ML-% IV SOLN
1000.0000 mg | Freq: Once | INTRAVENOUS | Status: AC
  Administered 2016-10-11: 1000 mg via INTRAVENOUS
  Filled 2016-10-11: qty 200

## 2016-10-11 MED ORDER — ALBUMIN HUMAN 5 % IV SOLN
INTRAVENOUS | Status: DC | PRN
  Administered 2016-10-11 (×2): via INTRAVENOUS

## 2016-10-11 MED ORDER — INSULIN REGULAR BOLUS VIA INFUSION
0.0000 [IU] | Freq: Three times a day (TID) | INTRAVENOUS | Status: AC
  Filled 2016-10-11: qty 10

## 2016-10-11 MED ORDER — BISACODYL 10 MG RE SUPP
10.0000 mg | Freq: Every day | RECTAL | Status: DC
  Administered 2016-10-16: 10 mg via RECTAL
  Filled 2016-10-11: qty 1

## 2016-10-11 MED ORDER — ACETAMINOPHEN 160 MG/5ML PO SOLN: 650.0000 mg | Freq: Once | ORAL | Status: AC

## 2016-10-11 MED ORDER — ACETAMINOPHEN 650 MG RE SUPP
650.0000 mg | Freq: Once | RECTAL | Status: AC
  Administered 2016-10-11: 650 mg via RECTAL

## 2016-10-11 MED ORDER — PROTAMINE SULFATE 10 MG/ML IV SOLN
INTRAVENOUS | Status: AC
  Filled 2016-10-11: qty 25

## 2016-10-11 MED ORDER — SODIUM CHLORIDE 0.45 % IV SOLN: INTRAVENOUS | Status: DC | PRN

## 2016-10-11 MED ORDER — SODIUM CHLORIDE 0.9 % IV SOLN: 30.0000 meq | Freq: Once | INTRAVENOUS | Status: DC

## 2016-10-11 MED ORDER — LACTATED RINGERS IV SOLN: INTRAVENOUS | Status: DC

## 2016-10-11 MED ORDER — ROCURONIUM BROMIDE 100 MG/10ML IV SOLN
INTRAVENOUS | Status: DC | PRN
  Administered 2016-10-11 (×4): 50 mg via INTRAVENOUS

## 2016-10-11 MED ORDER — ATORVASTATIN CALCIUM 20 MG PO TABS
20.0000 mg | ORAL_TABLET | Freq: Every day | ORAL | Status: DC
  Administered 2016-10-12 – 2016-10-19 (×8): 20 mg via ORAL
  Filled 2016-10-11 (×8): qty 1

## 2016-10-11 MED ORDER — HEPARIN SODIUM (PORCINE) 1000 UNIT/ML IJ SOLN
INTRAMUSCULAR | Status: AC
  Filled 2016-10-11: qty 1

## 2016-10-11 MED ORDER — TRAMADOL HCL 50 MG PO TABS
50.0000 mg | ORAL_TABLET | ORAL | Status: DC | PRN
  Administered 2016-10-12: 50 mg via ORAL
  Administered 2016-10-13: 100 mg via ORAL
  Administered 2016-10-13: 50 mg via ORAL
  Administered 2016-10-14: 100 mg via ORAL
  Administered 2016-10-14: 50 mg via ORAL
  Administered 2016-10-15 – 2016-10-18 (×2): 100 mg via ORAL
  Filled 2016-10-11: qty 1
  Filled 2016-10-11 (×3): qty 2
  Filled 2016-10-11 (×2): qty 1
  Filled 2016-10-11: qty 2

## 2016-10-11 MED ORDER — OXYCODONE HCL 5 MG PO TABS
5.0000 mg | ORAL_TABLET | ORAL | Status: DC | PRN
  Administered 2016-10-11 – 2016-10-14 (×8): 10 mg via ORAL
  Administered 2016-10-17: 5 mg via ORAL
  Filled 2016-10-11 (×6): qty 2
  Filled 2016-10-11: qty 1
  Filled 2016-10-11 (×2): qty 2

## 2016-10-11 MED ORDER — ASPIRIN 81 MG PO CHEW: 324.0000 mg | CHEWABLE_TABLET | Freq: Every day | ORAL | Status: DC

## 2016-10-11 MED ORDER — SODIUM CHLORIDE 0.9 % IV SOLN: 10.0000 mL/h | Freq: Once | INTRAVENOUS | Status: DC

## 2016-10-11 MED ORDER — LIDOCAINE 2% (20 MG/ML) 5 ML SYRINGE
INTRAMUSCULAR | Status: AC
  Filled 2016-10-11: qty 5

## 2016-10-11 MED ORDER — ACETAMINOPHEN 500 MG PO TABS
1000.0000 mg | ORAL_TABLET | Freq: Four times a day (QID) | ORAL | Status: AC
  Administered 2016-10-11 – 2016-10-16 (×17): 1000 mg via ORAL
  Filled 2016-10-11 (×16): qty 2

## 2016-10-11 MED ORDER — ACETAMINOPHEN 160 MG/5ML PO SOLN: 1000.0000 mg | Freq: Four times a day (QID) | ORAL | Status: AC

## 2016-10-11 MED ORDER — DOCUSATE SODIUM 100 MG PO CAPS
200.0000 mg | ORAL_CAPSULE | Freq: Every day | ORAL | Status: DC
  Administered 2016-10-12 – 2016-10-16 (×5): 200 mg via ORAL
  Filled 2016-10-11 (×6): qty 2

## 2016-10-11 MED ORDER — NITROGLYCERIN IN D5W 200-5 MCG/ML-% IV SOLN: 0.0000 ug/min | INTRAVENOUS | Status: DC

## 2016-10-11 MED ORDER — HEPARIN SODIUM (PORCINE) 1000 UNIT/ML IJ SOLN
INTRAMUSCULAR | Status: DC | PRN
  Administered 2016-10-11: 2000 [IU] via INTRAVENOUS
  Administered 2016-10-11: 18000 [IU] via INTRAVENOUS

## 2016-10-11 MED ORDER — ALBUMIN HUMAN 5 % IV SOLN
250.0000 mL | INTRAVENOUS | Status: AC | PRN
  Administered 2016-10-11: 250 mL via INTRAVENOUS

## 2016-10-11 MED ORDER — INSULIN REGULAR HUMAN 100 UNIT/ML IJ SOLN
INTRAMUSCULAR | Status: AC
  Administered 2016-10-11: 4.3 [IU]/h via INTRAVENOUS
  Filled 2016-10-11: qty 2.5

## 2016-10-11 MED ORDER — ASPIRIN EC 325 MG PO TBEC
325.0000 mg | DELAYED_RELEASE_TABLET | Freq: Every day | ORAL | Status: DC
Start: 2016-10-12 — End: 2016-10-15
  Administered 2016-10-12 – 2016-10-14 (×3): 325 mg via ORAL
  Filled 2016-10-11 (×4): qty 1

## 2016-10-11 MED ORDER — MORPHINE SULFATE (PF) 2 MG/ML IV SOLN
1.0000 mg | INTRAVENOUS | Status: AC | PRN
  Administered 2016-10-11 (×3): 2 mg via INTRAVENOUS
  Filled 2016-10-11: qty 2

## 2016-10-11 MED ORDER — PROPOFOL 10 MG/ML IV BOLUS
INTRAVENOUS | Status: DC | PRN
  Administered 2016-10-11: 50 mg via INTRAVENOUS
  Administered 2016-10-11: 150 mg via INTRAVENOUS

## 2016-10-11 MED ORDER — ARTIFICIAL TEARS OP OINT
TOPICAL_OINTMENT | OPHTHALMIC | Status: DC | PRN
  Administered 2016-10-11: 1 via OPHTHALMIC

## 2016-10-11 MED ORDER — LACTATED RINGERS IV SOLN: 500.0000 mL | Freq: Once | INTRAVENOUS | Status: DC | PRN

## 2016-10-11 MED ORDER — ARTIFICIAL TEARS OP OINT
TOPICAL_OINTMENT | OPHTHALMIC | Status: AC
  Filled 2016-10-11: qty 3.5

## 2016-10-11 MED ORDER — FENTANYL CITRATE (PF) 250 MCG/5ML IJ SOLN
INTRAMUSCULAR | Status: AC
  Filled 2016-10-11: qty 30

## 2016-10-11 MED ORDER — ORAL CARE MOUTH RINSE
15.0000 mL | Freq: Four times a day (QID) | OROMUCOSAL | Status: DC
  Administered 2016-10-11 – 2016-10-12 (×3): 15 mL via OROMUCOSAL

## 2016-10-11 MED ORDER — FENTANYL CITRATE (PF) 250 MCG/5ML IJ SOLN
INTRAMUSCULAR | Status: DC | PRN
  Administered 2016-10-11: 250 ug via INTRAVENOUS
  Administered 2016-10-11: 50 ug via INTRAVENOUS
  Administered 2016-10-11: 200 ug via INTRAVENOUS
  Administered 2016-10-11: 650 ug via INTRAVENOUS
  Administered 2016-10-11: 100 ug via INTRAVENOUS
  Administered 2016-10-11: 250 ug via INTRAVENOUS

## 2016-10-11 MED ORDER — DEXTROSE 5 % IV SOLN
1.5000 g | Freq: Two times a day (BID) | INTRAVENOUS | Status: AC
  Administered 2016-10-11 – 2016-10-13 (×4): 1.5 g via INTRAVENOUS
  Filled 2016-10-11 (×4): qty 1.5

## 2016-10-11 MED ORDER — DEXMEDETOMIDINE HCL IN NACL 200 MCG/50ML IV SOLN
0.0000 ug/kg/h | INTRAVENOUS | Status: DC
  Administered 2016-10-11: 0.7 ug/kg/h via INTRAVENOUS
  Filled 2016-10-11: qty 50

## 2016-10-11 MED ORDER — SUCCINYLCHOLINE CHLORIDE 200 MG/10ML IV SOSY
PREFILLED_SYRINGE | INTRAVENOUS | Status: AC
  Filled 2016-10-11: qty 10

## 2016-10-11 MED ORDER — GLYCOPYRROLATE 0.2 MG/ML IJ SOLN
INTRAMUSCULAR | Status: DC | PRN
  Administered 2016-10-11: 0.2 mg via INTRAVENOUS

## 2016-10-11 MED ORDER — ROCURONIUM BROMIDE 50 MG/5ML IV SOSY
PREFILLED_SYRINGE | INTRAVENOUS | Status: AC
  Filled 2016-10-11: qty 5

## 2016-10-11 MED ORDER — CLONIDINE HCL 0.2 MG/24HR TD PTWK
0.2000 mg | MEDICATED_PATCH | TRANSDERMAL | Status: DC
  Administered 2016-10-11: 0.2 mg via TRANSDERMAL
  Filled 2016-10-11: qty 1

## 2016-10-11 MED ORDER — HEMOSTATIC AGENTS (NO CHARGE) OPTIME
TOPICAL | Status: DC | PRN
  Administered 2016-10-11: 3 via TOPICAL

## 2016-10-11 MED ORDER — HEMOSTATIC AGENTS (NO CHARGE) OPTIME
TOPICAL | Status: DC | PRN
  Administered 2016-10-11: 1 via TOPICAL

## 2016-10-11 MED ORDER — PHENYLEPHRINE HCL 10 MG/ML IJ SOLN
0.0000 ug/min | INTRAMUSCULAR | Status: DC
  Filled 2016-10-11: qty 2

## 2016-10-11 MED ORDER — LIDOCAINE HCL (CARDIAC) 20 MG/ML IV SOLN
INTRAVENOUS | Status: DC | PRN
  Administered 2016-10-11: 60 mg via INTRAVENOUS

## 2016-10-11 MED ORDER — HYDRALAZINE HCL 20 MG/ML IJ SOLN
10.0000 mg | INTRAMUSCULAR | Status: DC | PRN
  Administered 2016-10-11: 10 mg via INTRAVENOUS

## 2016-10-11 MED ORDER — LACTATED RINGERS IV SOLN
INTRAVENOUS | Status: DC | PRN
  Administered 2016-10-11: 07:00:00 via INTRAVENOUS

## 2016-10-11 MED ORDER — METOCLOPRAMIDE HCL 5 MG/ML IJ SOLN
10.0000 mg | Freq: Four times a day (QID) | INTRAMUSCULAR | Status: AC
Start: 2016-10-11 — End: 2016-10-12
  Administered 2016-10-11 – 2016-10-12 (×4): 10 mg via INTRAVENOUS
  Filled 2016-10-11 (×3): qty 2

## 2016-10-11 MED ORDER — FAMOTIDINE IN NACL 20-0.9 MG/50ML-% IV SOLN
20.0000 mg | Freq: Two times a day (BID) | INTRAVENOUS | Status: DC
  Administered 2016-10-11: 20 mg via INTRAVENOUS

## 2016-10-11 MED ORDER — SODIUM CHLORIDE 0.9 % IV SOLN
INTRAVENOUS | Status: DC
  Administered 2016-10-17: 02:00:00 via INTRAVENOUS

## 2016-10-11 MED ORDER — SODIUM CHLORIDE 0.9% FLUSH: 3.0000 mL | INTRAVENOUS | Status: DC | PRN

## 2016-10-11 MED ORDER — CHLORHEXIDINE GLUCONATE 4 % EX LIQD: 30.0000 mL | CUTANEOUS | Status: DC

## 2016-10-11 MED ORDER — ONDANSETRON HCL 4 MG/2ML IJ SOLN
4.0000 mg | Freq: Four times a day (QID) | INTRAMUSCULAR | Status: DC | PRN
  Administered 2016-10-11 – 2016-10-17 (×6): 4 mg via INTRAVENOUS
  Filled 2016-10-11 (×6): qty 2

## 2016-10-11 MED ORDER — ORAL CARE MOUTH RINSE: 15.0000 mL | Freq: Four times a day (QID) | OROMUCOSAL | Status: DC

## 2016-10-11 MED ORDER — BISACODYL 5 MG PO TBEC
10.0000 mg | DELAYED_RELEASE_TABLET | Freq: Every day | ORAL | Status: DC
  Administered 2016-10-12 – 2016-10-16 (×5): 10 mg via ORAL
  Filled 2016-10-11 (×6): qty 2

## 2016-10-11 MED ORDER — ROCURONIUM BROMIDE 50 MG/5ML IV SOSY
PREFILLED_SYRINGE | INTRAVENOUS | Status: AC
  Filled 2016-10-11: qty 15

## 2016-10-11 MED ORDER — METOPROLOL TARTRATE 12.5 MG HALF TABLET
12.5000 mg | ORAL_TABLET | Freq: Two times a day (BID) | ORAL | Status: DC
  Administered 2016-10-12 – 2016-10-13 (×3): 12.5 mg via ORAL
  Filled 2016-10-11 (×3): qty 1

## 2016-10-11 MED ORDER — SODIUM CHLORIDE 0.9 % IV SOLN: 250.0000 mL | INTRAVENOUS | Status: DC

## 2016-10-11 MED ORDER — SODIUM CHLORIDE 0.9% FLUSH
3.0000 mL | Freq: Two times a day (BID) | INTRAVENOUS | Status: DC
  Administered 2016-10-13 – 2016-10-18 (×10): 3 mL via INTRAVENOUS

## 2016-10-11 MED ORDER — METOPROLOL TARTRATE 5 MG/5ML IV SOLN
2.5000 mg | INTRAVENOUS | Status: DC | PRN
  Administered 2016-10-11 – 2016-10-17 (×3): 5 mg via INTRAVENOUS
  Administered 2016-10-18: 2.5 mg via INTRAVENOUS
  Filled 2016-10-11 (×5): qty 5

## 2016-10-11 MED ORDER — CHLORHEXIDINE GLUCONATE 0.12 % MT SOLN
15.0000 mL | OROMUCOSAL | Status: AC
  Administered 2016-10-11: 15 mL via OROMUCOSAL

## 2016-10-11 MED ORDER — CHLORHEXIDINE GLUCONATE 0.12% ORAL RINSE (MEDLINE KIT)
15.0000 mL | Freq: Two times a day (BID) | OROMUCOSAL | Status: DC
  Administered 2016-10-11 – 2016-10-12 (×2): 15 mL via OROMUCOSAL

## 2016-10-11 MED ORDER — LACTATED RINGERS IV SOLN
INTRAVENOUS | Status: DC
  Administered 2016-10-11: 22:00:00 via INTRAVENOUS

## 2016-10-11 MED ORDER — NITROPRUSSIDE SODIUM 25 MG/ML IV SOLN
0.0000 ug/kg/min | INTRAVENOUS | Status: DC
  Administered 2016-10-11: 0.3 ug/kg/min via INTRAVENOUS
  Filled 2016-10-11: qty 2

## 2016-10-11 MED ORDER — MAGNESIUM SULFATE 4 GM/100ML IV SOLN
4.0000 g | Freq: Once | INTRAVENOUS | Status: AC
  Administered 2016-10-11: 4 g via INTRAVENOUS
  Filled 2016-10-11: qty 100

## 2016-10-11 MED ORDER — HYDRALAZINE HCL 20 MG/ML IJ SOLN
INTRAMUSCULAR | Status: AC
  Filled 2016-10-11: qty 1

## 2016-10-11 MED ORDER — MORPHINE SULFATE (PF) 2 MG/ML IV SOLN
2.0000 mg | INTRAVENOUS | Status: DC | PRN
  Administered 2016-10-11 – 2016-10-12 (×4): 2 mg via INTRAVENOUS
  Administered 2016-10-12: 4 mg via INTRAVENOUS
  Administered 2016-10-12 (×2): 2 mg via INTRAVENOUS
  Administered 2016-10-13: 4 mg via INTRAVENOUS
  Administered 2016-10-13: 2 mg via INTRAVENOUS
  Administered 2016-10-14: 4 mg via INTRAVENOUS
  Filled 2016-10-11 (×3): qty 1
  Filled 2016-10-11: qty 2
  Filled 2016-10-11 (×4): qty 1
  Filled 2016-10-11: qty 2
  Filled 2016-10-11: qty 1
  Filled 2016-10-11: qty 2

## 2016-10-11 MED ORDER — CHLORHEXIDINE GLUCONATE 0.12% ORAL RINSE (MEDLINE KIT): 15.0000 mL | Freq: Two times a day (BID) | OROMUCOSAL | Status: DC

## 2016-10-11 MED FILL — Magnesium Sulfate Inj 50%: INTRAMUSCULAR | Qty: 10 | Status: AC

## 2016-10-11 MED FILL — Heparin Sodium (Porcine) Inj 1000 Unit/ML: INTRAMUSCULAR | Qty: 30 | Status: AC

## 2016-10-11 MED FILL — Potassium Chloride Inj 2 mEq/ML: INTRAVENOUS | Qty: 40 | Status: AC

## 2016-10-11 SURGICAL SUPPLY — 97 items
ADAPTER CARDIO PERF ANTE/RETRO (ADAPTER) ×4 IMPLANT
BAG DECANTER FOR FLEXI CONT (MISCELLANEOUS) ×4 IMPLANT
BANDAGE ACE 4X5 VEL STRL LF (GAUZE/BANDAGES/DRESSINGS) ×4 IMPLANT
BANDAGE ACE 6X5 VEL STRL LF (GAUZE/BANDAGES/DRESSINGS) ×4 IMPLANT
BANDAGE ELASTIC 4 VELCRO ST LF (GAUZE/BANDAGES/DRESSINGS) ×4 IMPLANT
BANDAGE ELASTIC 6 VELCRO ST LF (GAUZE/BANDAGES/DRESSINGS) ×4 IMPLANT
BASKET HEART  (ORDER IN 25'S) (MISCELLANEOUS) ×1
BASKET HEART (ORDER IN 25'S) (MISCELLANEOUS) ×1
BASKET HEART (ORDER IN 25S) (MISCELLANEOUS) ×2 IMPLANT
BLADE STERNUM SYSTEM 6 (BLADE) ×4 IMPLANT
BLADE SURG 11 STRL SS (BLADE) ×4 IMPLANT
BLADE SURG 12 STRL SS (BLADE) ×4 IMPLANT
BLADE SURG ROTATE 9660 (MISCELLANEOUS) IMPLANT
BNDG GAUZE ELAST 4 BULKY (GAUZE/BANDAGES/DRESSINGS) ×4 IMPLANT
CANISTER SUCTION 2500CC (MISCELLANEOUS) ×4 IMPLANT
CANNULA GUNDRY RCSP 15FR (MISCELLANEOUS) ×4 IMPLANT
CATH CPB KIT VANTRIGT (MISCELLANEOUS) ×4 IMPLANT
CATH ROBINSON RED A/P 18FR (CATHETERS) ×12 IMPLANT
CATH THORACIC 36FR RT ANG (CATHETERS) ×4 IMPLANT
CLIP TI WIDE RED SMALL 24 (CLIP) ×4 IMPLANT
CRADLE DONUT ADULT HEAD (MISCELLANEOUS) ×4 IMPLANT
DERMABOND ADVANCED (GAUZE/BANDAGES/DRESSINGS) ×2
DERMABOND ADVANCED .7 DNX12 (GAUZE/BANDAGES/DRESSINGS) ×2 IMPLANT
DRAIN CHANNEL 32F RND 10.7 FF (WOUND CARE) ×4 IMPLANT
DRAPE CARDIOVASCULAR INCISE (DRAPES) ×2
DRAPE SLUSH/WARMER DISC (DRAPES) ×4 IMPLANT
DRAPE SRG 135X102X78XABS (DRAPES) ×2 IMPLANT
DRSG AQUACEL AG ADV 3.5X14 (GAUZE/BANDAGES/DRESSINGS) ×4 IMPLANT
ELECT BLADE 4.0 EZ CLEAN MEGAD (MISCELLANEOUS) ×4
ELECT BLADE 6.5 EXT (BLADE) ×4 IMPLANT
ELECT CAUTERY BLADE 6.4 (BLADE) ×4 IMPLANT
ELECT REM PT RETURN 9FT ADLT (ELECTROSURGICAL) ×8
ELECTRODE BLDE 4.0 EZ CLN MEGD (MISCELLANEOUS) ×2 IMPLANT
ELECTRODE REM PT RTRN 9FT ADLT (ELECTROSURGICAL) ×4 IMPLANT
FELT TEFLON 1X6 (MISCELLANEOUS) ×8 IMPLANT
GAUZE SPONGE 4X4 12PLY STRL (GAUZE/BANDAGES/DRESSINGS) ×8 IMPLANT
GLOVE BIO SURGEON STRL SZ7.5 (GLOVE) ×12 IMPLANT
GOWN STRL REUS W/ TWL LRG LVL3 (GOWN DISPOSABLE) ×8 IMPLANT
GOWN STRL REUS W/TWL LRG LVL3 (GOWN DISPOSABLE) ×8
HEMOSTAT POWDER SURGIFOAM 1G (HEMOSTASIS) ×12 IMPLANT
HEMOSTAT SURGICEL 2X14 (HEMOSTASIS) ×4 IMPLANT
INSERT FOGARTY XLG (MISCELLANEOUS) IMPLANT
KIT BASIN OR (CUSTOM PROCEDURE TRAY) ×4 IMPLANT
KIT ROOM TURNOVER OR (KITS) ×4 IMPLANT
KIT SUCTION CATH 14FR (SUCTIONS) ×4 IMPLANT
KIT VASOVIEW HEMOPRO VH 3000 (KITS) ×4 IMPLANT
LEAD PACING MYOCARDI (MISCELLANEOUS) ×4 IMPLANT
MARKER GRAFT CORONARY BYPASS (MISCELLANEOUS) ×12 IMPLANT
NS IRRIG 1000ML POUR BTL (IV SOLUTION) ×20 IMPLANT
PACK OPEN HEART (CUSTOM PROCEDURE TRAY) ×4 IMPLANT
PAD ARMBOARD 7.5X6 YLW CONV (MISCELLANEOUS) ×8 IMPLANT
PAD ELECT DEFIB RADIOL ZOLL (MISCELLANEOUS) ×4 IMPLANT
PENCIL BUTTON HOLSTER BLD 10FT (ELECTRODE) ×4 IMPLANT
PUNCH AORTIC ROTATE 4.0MM (MISCELLANEOUS) ×4 IMPLANT
PUNCH AORTIC ROTATE 4.5MM 8IN (MISCELLANEOUS) IMPLANT
PUNCH AORTIC ROTATE 5MM 8IN (MISCELLANEOUS) IMPLANT
SET CARDIOPLEGIA MPS 5001102 (MISCELLANEOUS) ×4 IMPLANT
SOLUTION ANTI FOG 6CC (MISCELLANEOUS) ×4 IMPLANT
SPONGE GAUZE 4X4 12PLY STER LF (GAUZE/BANDAGES/DRESSINGS) ×8 IMPLANT
SURGIFLO W/THROMBIN 8M KIT (HEMOSTASIS) ×4 IMPLANT
SUT BONE WAX W31G (SUTURE) ×4 IMPLANT
SUT MNCRL AB 4-0 PS2 18 (SUTURE) ×4 IMPLANT
SUT PROLENE 3 0 SH DA (SUTURE) IMPLANT
SUT PROLENE 3 0 SH1 36 (SUTURE) IMPLANT
SUT PROLENE 4 0 RB 1 (SUTURE) ×2
SUT PROLENE 4 0 SH DA (SUTURE) ×8 IMPLANT
SUT PROLENE 4-0 RB1 .5 CRCL 36 (SUTURE) ×2 IMPLANT
SUT PROLENE 5 0 C 1 36 (SUTURE) IMPLANT
SUT PROLENE 6 0 C 1 30 (SUTURE) IMPLANT
SUT PROLENE 6 0 CC (SUTURE) ×16 IMPLANT
SUT PROLENE 8 0 BV175 6 (SUTURE) IMPLANT
SUT PROLENE BLUE 7 0 (SUTURE) ×4 IMPLANT
SUT SILK  1 MH (SUTURE)
SUT SILK 1 MH (SUTURE) IMPLANT
SUT SILK 2 0 SH CR/8 (SUTURE) IMPLANT
SUT SILK 3 0 SH CR/8 (SUTURE) IMPLANT
SUT STEEL 6MS V (SUTURE) ×4 IMPLANT
SUT STEEL SZ 6 DBL 3X14 BALL (SUTURE) ×8 IMPLANT
SUT VIC AB 1 CTX 36 (SUTURE) ×4
SUT VIC AB 1 CTX36XBRD ANBCTR (SUTURE) ×4 IMPLANT
SUT VIC AB 2-0 CT1 27 (SUTURE) ×2
SUT VIC AB 2-0 CT1 TAPERPNT 27 (SUTURE) ×2 IMPLANT
SUT VIC AB 2-0 CTX 27 (SUTURE) IMPLANT
SUT VIC AB 3-0 X1 27 (SUTURE) IMPLANT
SUTURE E-PAK OPEN HEART (SUTURE) ×4 IMPLANT
SYSTEM SAHARA CHEST DRAIN ATS (WOUND CARE) ×4 IMPLANT
TAPE CLOTH SURG 6X10 WHT LF (GAUZE/BANDAGES/DRESSINGS) ×4 IMPLANT
TAPE PAPER 2X10 WHT MICROPORE (GAUZE/BANDAGES/DRESSINGS) ×4 IMPLANT
TOWEL OR 17X24 6PK STRL BLUE (TOWEL DISPOSABLE) ×8 IMPLANT
TOWEL OR 17X26 10 PK STRL BLUE (TOWEL DISPOSABLE) ×8 IMPLANT
TRAY FOLEY IC TEMP SENS 16FR (CATHETERS) ×4 IMPLANT
TUBE CONNECTING 20'X1/4 (TUBING) ×1
TUBE CONNECTING 20X1/4 (TUBING) ×3 IMPLANT
TUBING INSUFFLATION (TUBING) ×4 IMPLANT
UNDERPAD 30X30 (UNDERPADS AND DIAPERS) ×4 IMPLANT
WATER STERILE IRR 1000ML POUR (IV SOLUTION) ×8 IMPLANT
YANKAUER SUCT BULB TIP NO VENT (SUCTIONS) ×4 IMPLANT

## 2016-10-11 NOTE — Progress Notes (Signed)
      [address]       [place] [address]             [phone]      S/p CABG x 3  BP (!) 98/57   Pulse 88   Temp 99 F (37.2 C)   Resp (!) 23   Ht 5\' 4"  (1.626 m)   Wt 145 lb (65.8 kg)   SpO2 100%   BMI 24.89 kg/m    Intake/Output Summary (Last 24 hours) at 10/11/16 1752 Last data filed at 10/11/16 1600  Gross per 24 hour  Intake          2980.56 ml  Output             4110 ml  Net         -1129.44 ml   26/12 CO= 5/3  Hct= 30  Doing well early postop  Remo Lipps C. Roxan Hockey, MD [facility] [phone]

## 2016-10-11 NOTE — Brief Op Note (Signed)
10/11/2016  11:06 AM  PATIENT:  [name]  71 y.o. female  PRE-OPERATIVE DIAGNOSIS:  CAD  POST-OPERATIVE DIAGNOSIS:  CAD  PROCEDURE:  Procedure(s):  CORONARY ARTERY BYPASS GRAFTING x 3 -LIMA to LAD -SVG to DIAGONAL -SVG to OM1  ENDOSCOPIC HARVEST GREATER SAPHENOUS VEIN -Right Thigh  TRANSESOPHAGEAL ECHOCARDIOGRAM (TEE) (N/A)  SURGEON:  Surgeon(s) and Role:    * Ivin Poot, MD - Primary  PHYSICIAN ASSISTANT: Ellwood Handler PA-C  ANESTHESIA:   general  EBL:  Total I/O In: -  Out: 1300 [Urine:1300]  BLOOD ADMINISTERED:2U PRBC and CELLSAVER  DRAINS: Left Pleural Chest Tubes, Mediastinal Chest Drains   LOCAL MEDICATIONS USED:  NONE  SPECIMEN:  No Specimen  DISPOSITION OF SPECIMEN:  N/A  COUNTS:  YES  TOURNIQUET:  * No tourniquets in log *  DICTATION: .Dragon Dictation  PLAN OF CARE: Admit to inpatient   PATIENT DISPOSITION:  ICU - intubated and hemodynamically stable.   Delay start of Pharmacological VTE agent (>24hrs) due to surgical blood loss or risk of bleeding: yes

## 2016-10-11 NOTE — Anesthesia Postprocedure Evaluation (Signed)
Anesthesia Post Note  Patient: Graceleigh Kubecka  Procedure(s) Performed: Procedure(s) (LRB): CORONARY ARTERY BYPASS GRAFTING (CABG) times three using left internal mammary artery and right leg saphenous vein (N/A) TRANSESOPHAGEAL ECHOCARDIOGRAM (TEE) (N/A)  Patient location during evaluation: PACU Anesthesia Type: General Level of consciousness: patient remains intubated per anesthesia plan Respiratory status: patient remains intubated per anesthesia plan Cardiovascular status: stable Anesthetic complications: no       Last Vitals:  Vitals:   10/11/16 1404 10/11/16 1500  BP: (!) 215/84 (!) 103/52  Pulse:  88  Resp:  (!) 22  Temp:  36.9 C    Last Pain:  Vitals:   10/11/16 0608  TempSrc: Oral                 Mauricia Mertens

## 2016-10-11 NOTE — Progress Notes (Signed)
NIF -23, VC 0.60, ABG within normal limits. RT will continue to monitor.

## 2016-10-11 NOTE — Progress Notes (Signed)
The patient was examined and preop studies reviewed. There has been no change from the prior exam and the patient is ready for surgery. Plan CABG on [address]

## 2016-10-11 NOTE — Op Note (Deleted)
  The note originally documented on this encounter has been moved the the encounter in which it belongs.  

## 2016-10-11 NOTE — Progress Notes (Signed)
Initiated Open Heart Rapid wean per protocol. RT will continue to monitor.

## 2016-10-11 NOTE — Anesthesia Procedure Notes (Signed)
Central Venous Catheter Insertion Performed by: Duane Boston, anesthesiologist Start/End2/01/2017 6:51 AM, 10/11/2016 7:01 AM Patient location: Pre-op. Preanesthetic checklist: patient identified, IV checked, site marked, risks and benefits discussed, surgical consent, monitors and equipment checked, pre-op evaluation, timeout performed and anesthesia consent Position: Trendelenburg Lidocaine 1% used for infiltration and patient sedated Hand hygiene performed , maximum sterile barriers used  and Seldinger technique used Catheter size: 8.5 Fr Total catheter length 8. PA cath was placed.Sheath introducer Swan type:thermodilution PA Cath depth:50 Procedure performed using ultrasound guided technique. Ultrasound Notes:anatomy identified, needle tip was noted to be adjacent to the nerve/plexus identified, no ultrasound evidence of intravascular and/or intraneural injection and image(s) printed for medical record Attempts: 1 Following insertion, line sutured and dressing applied. Post procedure assessment: free fluid flow, blood return through all ports and no air  Patient tolerated the procedure well with no immediate complications.

## 2016-10-11 NOTE — Anesthesia Procedure Notes (Addendum)
Procedure Name: Intubation Date/Time: 10/11/2016 7:52 AM Performed by: Jacquiline Doe A Pre-anesthesia Checklist: Patient identified, Emergency Drugs available, Suction available and Patient being monitored Patient Re-evaluated:Patient Re-evaluated prior to inductionOxygen Delivery Method: Circle System Utilized Preoxygenation: Pre-oxygenation with 100% oxygen Intubation Type: IV induction Ventilation: Mask ventilation without difficulty and Oral airway inserted - appropriate to patient size Laryngoscope Size: Mac and 3 Grade View: Grade I Tube type: Oral Tube size: 8.0 mm Number of attempts: 1 Airway Equipment and Method: Stylet and Oral airway Placement Confirmation: ETT inserted through vocal cords under direct vision,  positive ETCO2 and breath sounds checked- equal and bilateral Secured at: 22 cm Tube secured with: Tape Dental Injury: Teeth and Oropharynx as per pre-operative assessment

## 2016-10-11 NOTE — Anesthesia Preprocedure Evaluation (Signed)
Anesthesia Evaluation  Patient identified by MRN, date of birth, ID band Patient awake    Reviewed: Allergy & Precautions, NPO status , Patient's Chart, lab work & pertinent test results  History of Anesthesia Complications (+) PONV  Airway Mallampati: II  TM Distance: >3 FB     Dental   Pulmonary neg pulmonary ROS,    breath sounds clear to auscultation       Cardiovascular hypertension, + CAD   Rhythm:Regular Rate:Normal     Neuro/Psych    GI/Hepatic Neg liver ROS, PUD, GERD  ,  Endo/Other  diabetes  Renal/GU Renal disease     Musculoskeletal  (+) Arthritis ,   Abdominal   Peds  Hematology  (+) anemia ,   Anesthesia Other Findings   Reproductive/Obstetrics                             Anesthesia Physical Anesthesia Plan  ASA: III  Anesthesia Plan: General   Post-op Pain Management:    Induction: Intravenous  Airway Management Planned: Oral ETT  Additional Equipment: Arterial line, PA Cath and TEE  Intra-op Plan:   Post-operative Plan:   Informed Consent: I have reviewed the patients History and Physical, chart, labs and discussed the procedure including the risks, benefits and alternatives for the proposed anesthesia with the patient or authorized representative who has indicated his/her understanding and acceptance.   Dental advisory given  Plan Discussed with: CRNA and Anesthesiologist  Anesthesia Plan Comments:         Anesthesia Quick Evaluation

## 2016-10-11 NOTE — Transfer of Care (Signed)
Immediate Anesthesia Transfer of Care Note  Patient: [name]  Procedure(s) Performed: Procedure(s): CORONARY ARTERY BYPASS GRAFTING (CABG) times three using left internal mammary artery and right leg saphenous vein (N/A) TRANSESOPHAGEAL ECHOCARDIOGRAM (TEE) (N/A)  Patient Location: SICU  Anesthesia Type:General  Level of Consciousness: Patient remains intubated per anesthesia plan  Airway & Oxygen Therapy: Patient remains intubated per anesthesia plan and Patient placed on Ventilator (see vital sign flow sheet for setting)  Post-op Assessment: Report given to RN and Post -op Vital signs reviewed and stable  Post vital signs: Reviewed and stable  Last Vitals:  Vitals:   10/11/16 0608 10/11/16 1317  BP: 131/60 (!) 119/53  Pulse: (!) 58 80  Resp: 18 12  Temp: 36.6 C     Last Pain:  Vitals:   10/11/16 0608  TempSrc: Oral         Complications: No apparent anesthesia complications

## 2016-10-11 NOTE — Procedures (Signed)
Extubation Procedure Note  Patient Details:   Name: August Labreck DOB: 1945-12-07 MRN: [id number]   Airway Documentation:     Evaluation  O2 sats: stable throughout Complications: No apparent complications Patient did tolerate procedure well. Bilateral Breath Sounds: Clear   Yes   Pt extubated to 4L [place] per Open Heart Rapid Wean Protocol. Pt VS within normal limits, no complications, pt stable throughout. NIF -23, VC 0.60 with fair effort. ABG within normal limits. Pt has a very weak non-productive cough, and is able to speak post extubation. RT will continue to monitor.   Laymond Purser M 10/11/2016, 6:18 PM

## 2016-10-11 NOTE — Progress Notes (Signed)
Pt with poor effort with VC and no effort with NIF at this time. RN aware. ABG within normal limits. Pt placed back on cpap/ps 10/5 at this time and RT will attempt NIF/VC again in 30 minutes. RT will continue to monitor

## 2016-10-11 NOTE — Progress Notes (Signed)
  Echocardiogram Echocardiogram Transesophageal has been performed.  [name] 10/11/2016, 8:54 AM

## 2016-10-11 NOTE — Op Note (Signed)
[name], [name]               ACCOUNT NO.:  [id number]  MEDICAL RECORD NO.:  [id number]  LOCATION:                               FACILITY:  [facility]  PHYSICIAN:  Ivin Poot, M.D.  DATE OF BIRTH:  02-14-1946  DATE OF PROCEDURE:  10/11/2016 DATE OF DISCHARGE:                              OPERATIVE REPORT   OPERATION: 1. Coronary bypass grafting x3 (left internal mammary artery to LAD,     saphenous vein graft to diagonal, saphenous vein graft to     circumflex marginal). 2. Endoscopic harvest of right greater saphenous vein.  SURGEON:  Ivin Poot, MD.  ASSISTANTEllwood Handler, PAC.  PREOPERATIVE DIAGNOSES:  Severe three-vessel coronary artery disease, positive stress test with pending major abdominal surgery.  POSTOPERATIVE DIAGNOSIS:  Severe three-vessel coronary artery disease, positive stress test with pending major abdominal surgery.  ANESTHESIA:  General by Dr. Finis Bud.  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  Prior to colon resection she underwent cardiac clearance.  Her stress test was positive for ischemia.  Her subsequent cardiac cath showed severe multivessel coronary artery disease.  Surgical coronary revascularization was recommended prior to her major abdominal colon resection.  I saw the patient in consultation in the office and discussed the procedure of CABG for treatment of her CAD.  I discussed the major aspects of the operation including the use of general anesthesia and cardiopulmonary bypass, the location of the surgical incisions, and the expected postoperative hospital recovery.  I discussed with the patient the risks to her of CABG including risks of stroke, bleeding, blood transfusion requirement, postoperative pulmonary problems including pleural effusion, infection, and death.  She understood that with her  significant preoperative anemia she would require blood transfusion therapy for heart surgery.  After reviewing these issues, she demonstrated her understanding and agreed to proceed with surgery under what I felt was an informed consent.  OPERATIVE FINDINGS: 1. Small, but adequate conduit. 2. Small, but adequate targets.  The ramus branch of the left coronary     was too small to graft. 3. Separation from cardiopulmonary bypass with normal global LV     function. 4. The operation required 2 units of packed cell transfusion upfront     as a blood prime for cardiopulmonary bypass circuit.  OPERATIVE PROCEDURE:  The patient was brought to the operating room and placed supine on the operating table.  General anesthesia was induced under invasive hemodynamic monitoring.  A transesophageal echo probe was placed by the Anesthesia team.  The chest, abdomen, and legs were prepped with Betadine and draped as a sterile field.  A sternal incision was made.  The saphenous vein was harvested endoscopically from the right leg.  The left internal mammary artery was harvested as a pedicle graft from its origin at the subclavian vessels.  It was a small 1.2 mm vessel, but with excellent flow.  The sternal retractor was placed.  The pericardium was opened and suspended.  Pursestrings were placed in ascending aorta and right atrium.  Heparin was administered and the ACT was  documented as being therapeutic.  The patient was then cannulated and placed on cardiopulmonary bypass.  The coronaries were identified for grafting.  The mid LAD, the first diagonal and the distal circumflex were found to be adequate targets.  The ramus branch was too small to graft.  The mammary artery and vein grafts were prepared for the anastomoses and cardioplegia cannulas were placed both in antegrade and retrograde coronary catheters.  The patient was cooled to 32 degrees and aortic crossclamp was applied.  One liter of cold  blood cardioplegia was delivered in split doses between the antegrade aortic and retrograde coronary sinus catheters were there was good cardioplegic arrest and septal temperature dropped less than 12 degrees.  Cardioplegia was delivered every 20 minutes.  The distal coronary anastomoses were performed.  The first distal anastomosis was to the distal circumflex.  There was a 1.2-mm vessel with proximal 90% stenosis.  A reverse saphenous vein was sewn end-to- side with running 7-0 Prolene with good flow through the graft. Cardioplegia was redosed.  The second distal anastomosis was placed to the first diagonal branch to the LAD.  This had a proximal 80% stenosis.  A reverse saphenous vein was sewn end-to-side with running 7-0 Prolene with good flow through the graft.  Cardioplegia was redosed.  The third distal anastomosis was to the mid LAD.  There was a proximal 80%-90% stenosis.  The left IMA pedicle was brought through an opening and the left lateral pericardium was brought down onto the LAD and sewn end-to-side with running 8-0 Prolene.  There was good flow through the anastomosis after briefly releasing the pedicle bulldog on the mammary artery.  The bulldog was reapplied and the pedicle secured to the epicardium with 6-0 Prolene.  Cardioplegia was redosed.  While the crossclamp still in place, 2 proximal vein anastomoses were performed on the ascending aorta using a 4.5-mm punch and running 6-0 Prolene.  Prior to tying down the final proximal anastomosis air was vented from the coronaries with a dose of retrograde warm blood cardioplegia.  The crossclamp was removed.  The heart resumed a spontaneous rhythm.  The vein grafts were de-aired and opened.  Each had good flow and hemostasis was documented at the proximal and distal anastomoses.  The patient was rewarmed and reperfused.  Temporary pacing wires were applied.  The lungs were expanded.  The ventilator was resumed.  The  patient was placed on low- dose milrinone and then weaned off cardiopulmonary bypass without difficulty.  Echo showed normal global LV function.  Hemodynamics were stable.  Protamine was administered without adverse reaction.  The cannulas were removed.  The patient remained stable.  The superior pericardial fat was closed over the aorta.  Anterior mediastinal and left pleural chest tubes were placed and brought out through separate incisions.  The sternum was closed with interrupted steel wire.  The pectoralis fascia was closed with a running #1 Vicryl.  The subcutaneous and skin layers were closed running Vicryl and sterile dressings were applied. Total cardiopulmonary bypass time was and 20 minutes.     Ivin Poot, M.D.   ______________________________ Ivin Poot, M.D.    PV/MEDQ  D:  10/11/2016  T:  10/11/2016  Job:  [id number]  cc:   Belva Crome, M.D.

## 2016-10-12 ENCOUNTER — Encounter: Payer: Self-pay | Admitting: Hematology & Oncology

## 2016-10-12 ENCOUNTER — Inpatient Hospital Stay (HOSPITAL_COMMUNITY): Payer: Medicare Other

## 2016-10-12 ENCOUNTER — Encounter (HOSPITAL_COMMUNITY): Payer: Self-pay | Admitting: Cardiothoracic Surgery

## 2016-10-12 LAB — GLUCOSE, CAPILLARY
Glucose-Capillary: 102 mg/dL — ABNORMAL HIGH (ref 65–99)
Glucose-Capillary: 104 mg/dL — ABNORMAL HIGH (ref 65–99)
Glucose-Capillary: 115 mg/dL — ABNORMAL HIGH (ref 65–99)
Glucose-Capillary: 116 mg/dL — ABNORMAL HIGH (ref 65–99)
Glucose-Capillary: 120 mg/dL — ABNORMAL HIGH (ref 65–99)
Glucose-Capillary: 124 mg/dL — ABNORMAL HIGH (ref 65–99)
Glucose-Capillary: 125 mg/dL — ABNORMAL HIGH (ref 65–99)
Glucose-Capillary: 128 mg/dL — ABNORMAL HIGH (ref 65–99)
Glucose-Capillary: 129 mg/dL — ABNORMAL HIGH (ref 65–99)
Glucose-Capillary: 158 mg/dL — ABNORMAL HIGH (ref 65–99)
Glucose-Capillary: 163 mg/dL — ABNORMAL HIGH (ref 65–99)
Glucose-Capillary: 81 mg/dL (ref 65–99)
Glucose-Capillary: 89 mg/dL (ref 65–99)
Glucose-Capillary: 94 mg/dL (ref 65–99)

## 2016-10-12 LAB — TYPE AND SCREEN
ABO/RH(D): O POS
Antibody Screen: NEGATIVE
Unit division: 0
Unit division: 0
Unit division: 0
Unit division: 0

## 2016-10-12 LAB — CBC
HCT: 28.3 % — ABNORMAL LOW (ref 36.0–46.0)
HEMATOCRIT: 30.6 % — AB (ref 36.0–46.0)
HEMOGLOBIN: 9 g/dL — AB (ref 12.0–15.0)
HEMOGLOBIN: 9.8 g/dL — AB (ref 12.0–15.0)
MCH: 26.8 pg (ref 26.0–34.0)
MCH: 27.5 pg (ref 26.0–34.0)
MCHC: 31.8 g/dL (ref 30.0–36.0)
MCHC: 32 g/dL (ref 30.0–36.0)
MCV: 84.2 fL (ref 78.0–100.0)
MCV: 86 fL (ref 78.0–100.0)
Platelets: 123 10*3/uL — ABNORMAL LOW (ref 150–400)
Platelets: 115 10*3/uL — ABNORMAL LOW (ref 150–400)
RBC: 3.36 MIL/uL — ABNORMAL LOW (ref 3.87–5.11)
RBC: 3.56 MIL/uL — ABNORMAL LOW (ref 3.87–5.11)
RDW: 24.8 % — AB (ref 11.5–15.5)
RDW: 25.4 % — AB (ref 11.5–15.5)
WBC: 7.6 10*3/uL (ref 4.0–10.5)
WBC: 7 10*3/uL (ref 4.0–10.5)

## 2016-10-12 LAB — BASIC METABOLIC PANEL
Anion gap: 8 (ref 5–15)
BUN: 12 mg/dL (ref 6–20)
CHLORIDE: 107 mmol/L (ref 101–111)
CO2: 25 mmol/L (ref 22–32)
Calcium: 8.4 mg/dL — ABNORMAL LOW (ref 8.9–10.3)
Creatinine, Ser: 0.9 mg/dL (ref 0.44–1.00)
GFR calc non Af Amer: >60 mL/min (ref >60)
Glucose, Bld: 85 mg/dL (ref 65–99)
Potassium: 4 mmol/L (ref 3.5–5.1)
SODIUM: 140 mmol/L (ref 135–145)

## 2016-10-12 LAB — POCT I-STAT, CHEM 8
BUN: 11 mg/dL (ref 6–20)
Calcium, Ion: 1.24 mmol/L (ref 1.15–1.40)
Chloride: 100 mmol/L — ABNORMAL LOW (ref 101–111)
Creatinine, Ser: 1 mg/dL (ref 0.44–1.00)
Glucose, Bld: 124 mg/dL — ABNORMAL HIGH (ref 65–99)
HCT: 29 % — ABNORMAL LOW (ref 36.0–46.0)
Hemoglobin: 9.9 g/dL — ABNORMAL LOW (ref 12.0–15.0)
Potassium: 4.1 mmol/L (ref 3.5–5.1)
Sodium: 138 mmol/L (ref 135–145)
TCO2: 27 mmol/L (ref 0–100)

## 2016-10-12 LAB — MAGNESIUM
MAGNESIUM: 2.3 mg/dL (ref 1.7–2.4)
MAGNESIUM: 2 mg/dL (ref 1.7–2.4)

## 2016-10-12 LAB — PREPARE FRESH FROZEN PLASMA: UNIT DIVISION: 0

## 2016-10-12 LAB — CREATININE, SERUM
CREATININE: 1.01 mg/dL — AB (ref 0.44–1.00)
GFR calc Af Amer: >60 mL/min (ref >60)
GFR calc non Af Amer: 55 mL/min — ABNORMAL LOW (ref >60)

## 2016-10-12 MED ORDER — INSULIN DETEMIR 100 UNIT/ML ~~LOC~~ SOLN
30.0000 [IU] | Freq: Every day | SUBCUTANEOUS | Status: DC
  Administered 2016-10-12: 30 [IU] via SUBCUTANEOUS
  Filled 2016-10-12 (×2): qty 0.3

## 2016-10-12 MED ORDER — FUROSEMIDE 10 MG/ML IJ SOLN
40.0000 mg | Freq: Once | INTRAMUSCULAR | Status: AC
  Administered 2016-10-12: 40 mg via INTRAVENOUS
  Filled 2016-10-12: qty 4

## 2016-10-12 MED ORDER — INSULIN ASPART 100 UNIT/ML ~~LOC~~ SOLN
3.0000 [IU] | Freq: Three times a day (TID) | SUBCUTANEOUS | Status: DC
Start: 2016-10-12 — End: 2016-10-15
  Administered 2016-10-12: 3 [IU] via SUBCUTANEOUS

## 2016-10-12 MED ORDER — CHLORHEXIDINE GLUCONATE CLOTH 2 % EX PADS
6.0000 | MEDICATED_PAD | Freq: Every day | CUTANEOUS | Status: AC
  Administered 2016-10-13 – 2016-10-17 (×4): 6 via TOPICAL

## 2016-10-12 MED ORDER — DIPHENHYDRAMINE HCL 25 MG PO CAPS
25.0000 mg | ORAL_CAPSULE | Freq: Four times a day (QID) | ORAL | Status: DC | PRN
  Administered 2016-10-14 – 2016-10-19 (×6): 25 mg via ORAL
  Filled 2016-10-12 (×6): qty 1

## 2016-10-12 MED ORDER — ENOXAPARIN SODIUM 30 MG/0.3ML ~~LOC~~ SOLN
30.0000 mg | Freq: Every day | SUBCUTANEOUS | Status: DC
  Administered 2016-10-12 – 2016-10-18 (×7): 30 mg via SUBCUTANEOUS
  Filled 2016-10-12 (×7): qty 0.3

## 2016-10-12 MED ORDER — INSULIN DETEMIR 100 UNIT/ML ~~LOC~~ SOLN
30.0000 [IU] | Freq: Every day | SUBCUTANEOUS | Status: DC
Start: 2016-10-13 — End: 2016-10-12

## 2016-10-12 MED ORDER — INSULIN ASPART 100 UNIT/ML ~~LOC~~ SOLN
0.0000 [IU] | SUBCUTANEOUS | Status: DC
  Administered 2016-10-12 (×2): 4 [IU] via SUBCUTANEOUS
  Administered 2016-10-13: 2 [IU] via SUBCUTANEOUS
  Administered 2016-10-13: 4 [IU] via SUBCUTANEOUS
  Administered 2016-10-13: 2 [IU] via SUBCUTANEOUS
  Administered 2016-10-13: 4 [IU] via SUBCUTANEOUS
  Administered 2016-10-14 (×3): 2 [IU] via SUBCUTANEOUS
  Administered 2016-10-14: 8 [IU] via SUBCUTANEOUS
  Administered 2016-10-14: 2 [IU] via SUBCUTANEOUS
  Administered 2016-10-14: 4 [IU] via SUBCUTANEOUS

## 2016-10-12 MED ORDER — MUPIROCIN 2 % EX OINT
1.0000 "application " | TOPICAL_OINTMENT | Freq: Two times a day (BID) | CUTANEOUS | Status: AC
  Administered 2016-10-12 – 2016-10-17 (×10): 1 via NASAL
  Filled 2016-10-12 (×3): qty 22

## 2016-10-12 MED FILL — Mannitol IV Soln 20%: INTRAVENOUS | Qty: 500 | Status: AC

## 2016-10-12 MED FILL — Heparin Sodium (Porcine) Inj 1000 Unit/ML: INTRAMUSCULAR | Qty: 10 | Status: AC

## 2016-10-12 MED FILL — Sodium Bicarbonate IV Soln 8.4%: INTRAVENOUS | Qty: 50 | Status: AC

## 2016-10-12 MED FILL — Lidocaine HCl IV Inj 20 MG/ML: INTRAVENOUS | Qty: 5 | Status: AC

## 2016-10-12 MED FILL — Sodium Chloride IV Soln 0.9%: INTRAVENOUS | Qty: 2000 | Status: AC

## 2016-10-12 MED FILL — Electrolyte-R (PH 7.4) Solution: INTRAVENOUS | Qty: 5000 | Status: AC

## 2016-10-12 NOTE — Progress Notes (Signed)
Patient ID: [name], female   DOB: 10-06-1945, 71 y.o.   MRN: [id number]   SICU Evening Rounds:   Hemodynamically stable  Sinus rhythm  Urine output good     CBC    Component Value Date/Time   WBC 7.0 10/12/2016 1757   RBC 3.56 (L) 10/12/2016 1757   HGB 9.8 (L) 10/12/2016 1757   HGB 10.8 (L) 10/01/2016 0935   HCT 30.6 (L) 10/12/2016 1757   HCT 35.0 10/01/2016 0935   PLT PENDING 10/12/2016 1757   PLT 214 10/01/2016 0935   PLT 188 09/27/2016 1428   MCV 86.0 10/12/2016 1757   MCV 83 10/01/2016 0935   MCH 27.5 10/12/2016 1757   MCHC 32.0 10/12/2016 1757   RDW 25.4 (H) 10/12/2016 1757   RDW 26.3 (H) 10/01/2016 0935   LYMPHSABS 0.7 (L) 10/01/2016 0935   MONOABS 385 09/15/2016 1412   EOSABS 0.3 10/01/2016 0935   BASOSABS 0.0 10/01/2016 0935     BMET    Component Value Date/Time   NA 140 10/12/2016 0333   NA 141 10/01/2016 0935   K 4.0 10/12/2016 0333   K 5.1 (H) 10/01/2016 0935   CL 107 10/12/2016 0333   CL 106 10/01/2016 0935   CO2 25 10/12/2016 0333   CO2 25 10/01/2016 0935   GLUCOSE 85 10/12/2016 0333   GLUCOSE 192 (H) 10/01/2016 0935   BUN 12 10/12/2016 0333   BUN 22 10/01/2016 0935   CREATININE 1.01 (H) 10/12/2016 1757   CREATININE 1.1 10/01/2016 0935   CALCIUM 8.4 (L) 10/12/2016 0333   CALCIUM 9.7 10/01/2016 0935   GFRNONAA 55 (L) 10/12/2016 1757   GFRNONAA 38 (L) 09/15/2016 1412   GFRAA >60 10/12/2016 1757   GFRAA 44 (L) 09/15/2016 1412     A/P:  Stable postop course. Continue current plans

## 2016-10-12 NOTE — Progress Notes (Signed)
Inpatient Diabetes Program Recommendations  AACE/ADA: New Consensus Statement on Inpatient Glycemic Control (2015)  Target Ranges:  Prepandial:   less than 140 mg/dL      Peak postprandial:   less than 180 mg/dL (1-2 hours)      Critically ill patients:  140 - 180 mg/dL   Lab Results  Component Value Date   GLUCAP 129 (H) 10/12/2016   HGBA1C 5.6 10/08/2016    Review of Glycemic Control  Diabetes history: DM2 Outpatient Diabetes medications: Medtronics 630G pump. Basal: 12a 0.8, 6a 0.9, 10p 0.8. Bolus: 2/3.5/5.5 units. ISF 40. Total: 40 units/day    Current orders for Inpatient glycemic control: IV insulin   Patient's DM is managed by Dr. Hartford Poli in [place] and last appt. 09/01/16 and made decreases in bolus and basal. Total basal per 24 hrs. = 19.6 units and Novolog meal coverage 2 units ac breakfast + 3.5 units ac lunch + 5.5 units ac dinner. Will follow.  Thank you, Nani Gasser. Takerra Lupinacci, RN, MSN, CDE Inpatient Glycemic Control Team Team Pager [phone] (8am-5pm) 10/12/2016 9:44 AM

## 2016-10-12 NOTE — Care Management Note (Signed)
Case Management Note  Patient Details  Name: Natajia Suddreth MRN: [id number] Date of Birth: 05-19-1946  Subjective/Objective:    Pt lives with spouse who will be available for assistance 24/7.  She states daughter works but will check in after work every day and assist as needed.                      Expected Discharge Plan:  [place]  Discharge planning Services  CM Consult  Status of Service:  In process, will continue to follow  Girard Cooter, RN 10/12/2016, 2:28 PM

## 2016-10-12 NOTE — Progress Notes (Signed)
1 Day Post-Op Procedure(s) (LRB): CORONARY ARTERY BYPASS GRAFTING (CABG) times three using left internal mammary artery and right leg saphenous vein (N/A) TRANSESOPHAGEAL ECHOCARDIOGRAM (TEE) (N/A) Subjective: Complains of pain.   Objective: Vital signs in last 24 hours: Temp:  [96.6 F (35.9 C)-100.6 F (38.1 C)] 99.1 F (37.3 C) (02/06 0800) Pulse Rate:  [80-96] 94 (02/06 0815) Cardiac Rhythm: Normal sinus rhythm;Other (Comment) (02/06 0556) Resp:  [10-27] 14 (02/06 0800) BP: (90-215)/(46-89) 134/60 (02/06 0815) SpO2:  [97 %-100 %] 98 % (02/06 0800) Arterial Line BP: (96-210)/(40-85) 163/50 (02/06 0800) FiO2 (%):  [50 %] 50 % (02/05 1516) Weight:  [70.6 kg (155 lb 10.3 oz)] 70.6 kg (155 lb 10.3 oz) (02/06 0500)  Hemodynamic parameters for last 24 hours: PAP: (22-41)/(10-19) 40/16 CO:  [2.5 L/min-6.3 L/min] 6.3 L/min CI:  [3.6 L/min/m2-3.7 L/min/m2] 3.7 L/min/m2  Intake/Output from previous day: 02/05 0701 - 02/06 0700 In: 4060.8 [I.V.:2770.8; Blood:540; IV Piggyback:750] Out: [id number] [Urine:4585; Blood:600; Chest Tube:290] Intake/Output this shift: No intake/output data recorded.  General appearance: alert and cooperative Neurologic: intact Heart: regular rate and rhythm, S1, S2 normal, no murmur, click, rub or gallop Lungs: clear to auscultation bilaterally Extremities: edema mild Wound: dressing dry  Lab Results:  Recent Labs  10/11/16 1915 10/12/16 0333  WBC 7.0 7.6  HGB 9.5* 9.0*  HCT 29.8* 28.3*  PLT 129* 123*   BMET:  Recent Labs  10/11/16 1858 10/11/16 1915 10/12/16 0333  NA 141  --  140  K 3.8  --  4.0  CL 106  --  107  CO2  --   --  25  GLUCOSE 132*  --  85  BUN 14  --  12  CREATININE 0.80 0.86 0.90  CALCIUM  --   --  8.4*    PT/INR:  Recent Labs  10/11/16 1611  LABPROT 16.3*  INR 1.30   ABG    Component Value Date/Time   PHART 7.369 10/11/2016 1902   HCO3 24.1 10/11/2016 1902   TCO2 25 10/11/2016 1902   ACIDBASEDEF 1.0 10/11/2016  1902   O2SAT 99.0 10/11/2016 1902   CBG (last 3)   Recent Labs  10/12/16 0447 10/12/16 0550 10/12/16 0649  GLUCAP 89 128* 129*   CXR: ok  ECG: sinus, normal  Assessment/Plan: S/P Procedure(s) (LRB): CORONARY ARTERY BYPASS GRAFTING (CABG) times three using left internal mammary artery and right leg saphenous vein (N/A) TRANSESOPHAGEAL ECHOCARDIOGRAM (TEE) (N/A)  She is hemodynamically stable in sinus rhythm on Milrinone. Preop EF normal so will wean off milrinone. Mobilize Diuresis Diabetes control: start Levemir and SSI with meal coverage d/c tubes/lines Continue foley due to diuresing patient and patient in ICU See progression orders   LOS: 1 day    Gaye Pollack 10/12/2016

## 2016-10-13 ENCOUNTER — Inpatient Hospital Stay (HOSPITAL_COMMUNITY): Payer: Medicare Other

## 2016-10-13 ENCOUNTER — Other Ambulatory Visit: Payer: Self-pay

## 2016-10-13 LAB — GLUCOSE, CAPILLARY
Glucose-Capillary: 140 mg/dL — ABNORMAL HIGH (ref 65–99)
Glucose-Capillary: 145 mg/dL — ABNORMAL HIGH (ref 65–99)
Glucose-Capillary: 185 mg/dL — ABNORMAL HIGH (ref 65–99)
Glucose-Capillary: 237 mg/dL — ABNORMAL HIGH (ref 65–99)
Glucose-Capillary: 56 mg/dL — ABNORMAL LOW (ref 65–99)
Glucose-Capillary: 57 mg/dL — ABNORMAL LOW (ref 65–99)
Glucose-Capillary: 74 mg/dL (ref 65–99)
Glucose-Capillary: 97 mg/dL (ref 65–99)

## 2016-10-13 LAB — BASIC METABOLIC PANEL
Anion gap: 9 (ref 5–15)
BUN: 9 mg/dL (ref 6–20)
CALCIUM: 8.9 mg/dL (ref 8.9–10.3)
CO2: 28 mmol/L (ref 22–32)
Chloride: 101 mmol/L (ref 101–111)
Creatinine, Ser: 0.97 mg/dL (ref 0.44–1.00)
GFR calc Af Amer: >60 mL/min (ref >60)
GFR, EST NON AFRICAN AMERICAN: 57 mL/min — AB (ref >60)
GLUCOSE: 114 mg/dL — AB (ref 65–99)
POTASSIUM: 4.3 mmol/L (ref 3.5–5.1)
Sodium: 138 mmol/L (ref 135–145)

## 2016-10-13 LAB — CBC
HCT: 30.2 % — ABNORMAL LOW (ref 36.0–46.0)
Hemoglobin: 9.5 g/dL — ABNORMAL LOW (ref 12.0–15.0)
MCH: 27.3 pg (ref 26.0–34.0)
MCHC: 31.5 g/dL (ref 30.0–36.0)
MCV: 86.8 fL (ref 78.0–100.0)
Platelets: 110 10*3/uL — ABNORMAL LOW (ref 150–400)
RBC: 3.48 MIL/uL — ABNORMAL LOW (ref 3.87–5.11)
RDW: 25.9 % — AB (ref 11.5–15.5)
WBC: 6.9 10*3/uL (ref 4.0–10.5)

## 2016-10-13 MED ORDER — INSULIN DETEMIR 100 UNIT/ML ~~LOC~~ SOLN
15.0000 [IU] | Freq: Every day | SUBCUTANEOUS | Status: DC
  Administered 2016-10-13 – 2016-10-15 (×3): 15 [IU] via SUBCUTANEOUS
  Filled 2016-10-13 (×5): qty 0.15

## 2016-10-13 MED ORDER — SODIUM CHLORIDE 0.9% FLUSH
3.0000 mL | Freq: Two times a day (BID) | INTRAVENOUS | Status: DC
  Administered 2016-10-14: 3 mL via INTRAVENOUS

## 2016-10-13 MED ORDER — AMIODARONE LOAD VIA INFUSION
150.0000 mg | Freq: Once | INTRAVENOUS | Status: AC
  Administered 2016-10-13: 150 mg via INTRAVENOUS
  Filled 2016-10-13: qty 83.34

## 2016-10-13 MED ORDER — FUROSEMIDE 40 MG PO TABS
40.0000 mg | ORAL_TABLET | Freq: Every day | ORAL | Status: DC
Start: 2016-10-14 — End: 2016-10-14

## 2016-10-13 MED ORDER — METOCLOPRAMIDE HCL 5 MG/ML IJ SOLN
10.0000 mg | Freq: Four times a day (QID) | INTRAMUSCULAR | Status: AC
  Administered 2016-10-13 – 2016-10-14 (×4): 10 mg via INTRAVENOUS
  Filled 2016-10-13 (×4): qty 2

## 2016-10-13 MED ORDER — ALBUMIN HUMAN 5 % IV SOLN
INTRAVENOUS | Status: AC
  Filled 2016-10-13: qty 250

## 2016-10-13 MED ORDER — SODIUM CHLORIDE 0.9% FLUSH: 3.0000 mL | INTRAVENOUS | Status: DC | PRN

## 2016-10-13 MED ORDER — SODIUM CHLORIDE 0.9 % IV SOLN: 250.0000 mL | INTRAVENOUS | Status: DC | PRN

## 2016-10-13 MED ORDER — AMIODARONE HCL IN DEXTROSE 360-4.14 MG/200ML-% IV SOLN
30.0000 mg/h | INTRAVENOUS | Status: DC
  Administered 2016-10-13: 30 mg/h via INTRAVENOUS
  Filled 2016-10-13: qty 200

## 2016-10-13 MED ORDER — ORAL CARE MOUTH RINSE
15.0000 mL | Freq: Two times a day (BID) | OROMUCOSAL | Status: DC
  Administered 2016-10-13 (×2): 15 mL via OROMUCOSAL

## 2016-10-13 MED ORDER — FUROSEMIDE 10 MG/ML IJ SOLN
40.0000 mg | Freq: Once | INTRAMUSCULAR | Status: AC
  Administered 2016-10-13: 40 mg via INTRAVENOUS
  Filled 2016-10-13: qty 4

## 2016-10-13 MED ORDER — POTASSIUM CHLORIDE CRYS ER 20 MEQ PO TBCR
20.0000 meq | EXTENDED_RELEASE_TABLET | Freq: Every day | ORAL | Status: DC
  Administered 2016-10-13: 20 meq via ORAL
  Filled 2016-10-13: qty 1

## 2016-10-13 MED ORDER — SODIUM CHLORIDE 0.9 % IV SOLN
0.0000 ug/min | INTRAVENOUS | Status: DC
  Administered 2016-10-13 – 2016-10-14 (×2): 40 ug/min via INTRAVENOUS
  Filled 2016-10-13 (×3): qty 2

## 2016-10-13 MED ORDER — AMIODARONE HCL IN DEXTROSE 360-4.14 MG/200ML-% IV SOLN
60.0000 mg/h | INTRAVENOUS | Status: AC
  Filled 2016-10-13 (×2): qty 200

## 2016-10-13 MED ORDER — CHLORHEXIDINE GLUCONATE 0.12 % MT SOLN
15.0000 mL | Freq: Two times a day (BID) | OROMUCOSAL | Status: DC
  Administered 2016-10-13 – 2016-10-14 (×3): 15 mL via OROMUCOSAL
  Filled 2016-10-13 (×2): qty 15

## 2016-10-13 MED ORDER — ALBUMIN HUMAN 5 % IV SOLN
250.0000 mL | Freq: Once | INTRAVENOUS | Status: AC
  Administered 2016-10-13: 250 mL via INTRAVENOUS

## 2016-10-13 MED ORDER — MOVING RIGHT ALONG BOOK
Freq: Once | Status: AC
  Administered 2016-10-13: 1
  Filled 2016-10-13 (×2): qty 1

## 2016-10-13 NOTE — Progress Notes (Addendum)
10/13/2016 1310 Pt. With 5.58 sec pause and converted to SB 40-50. Pt. Symptomatic at this time BP 74/44. Dr. Cyndia Bent paged and made aware. Verbal order received to give albumin x 1, ok to remain on IV amiodarone, and  Ok to start Neo back if needed. Pt. AV paced currently at 60. Verbal order received to try to d/c external pacer for HR 50-60 and stable BP. Orders enacted. Will continue to closely monitor patient.  Order also received to d/c metoprolol po, orders enacted.  Latonja Bobeck, [name]

## 2016-10-13 NOTE — Progress Notes (Signed)
Patient glucose at 0000 was 57.  Recheck from CVC draw 85. Patient VSS and neuro intact. 4oz soda given PO.  15 minute recheck glucose 74.  4oz of soda given PO again since meal is greater than 1hour away.  Will continue to monitor.  Clyda Hurdle RN.

## 2016-10-13 NOTE — Progress Notes (Addendum)
10/13/2016 1030 Pt. In afib RVR rate 150-170. Pt. States she can feel her heart racing. VSS. Dr. Cyndia Bent paged and made aware. Orders received and enacted for amiodarone bolus and gtt as well as leave RIJ in place. Will continue to closely monitor patient.  [name], [name]

## 2016-10-13 NOTE — Progress Notes (Signed)
Amiodarone Drug - Drug Interaction Consult Note  Recommendations: No major issues noted. Monitor for muscle pain/weakness on statin, increased risk of bradycardia with BB, cardiotoxicity risk of hypokalemia occurs on lasix.  Amiodarone is metabolized by the cytochrome P450 system and therefore has the potential to cause many drug interactions. Amiodarone has an average plasma half-life of 50 days (range 20 to 100 days).   There is potential for drug interactions to occur several weeks or months after stopping treatment and the onset of drug interactions may be slow after initiating amiodarone.   [x]  Statins: Increased risk of myopathy. Simvastatin- restrict dose to 20mg  daily. Other statins: counsel patients to report any muscle pain or weakness immediately. Atorvastatin  []  Anticoagulants: Amiodarone can increase anticoagulant effect. Consider warfarin dose reduction. Patients should be monitored closely and the dose of anticoagulant altered accordingly, remembering that amiodarone levels take several weeks to stabilize.  []  Antiepileptics: Amiodarone can increase plasma concentration of phenytoin, the dose should be reduced. Note that small changes in phenytoin dose can result in large changes in levels. Monitor patient and counsel on signs of toxicity.  [x]  Beta blockers: increased risk of bradycardia, AV block and myocardial  depression. Sotalol - avoid concomitant use. Lopressor  []   Calcium channel blockers (diltiazem and verapamil): increased risk of bradycardia, AV block and myocardial depression.  []   Cyclosporine: Amiodarone increases levels of cyclosporine. Reduced dose of cyclosporine is recommended.  []  Digoxin dose should be halved when amiodarone is started.  [x]  Diuretics: increased risk of cardiotoxicity if hypokalemia occurs. Lasix  []  Oral hypoglycemic agents (glyburide, glipizide, glimepiride): increased risk of hypoglycemia. Patient's glucose levels should be monitored  closely when initiating amiodarone therapy.   []  Drugs that prolong the QT interval:  Torsades de pointes risk may be increased with concurrent use - avoid if possible.  Monitor QTc, also keep magnesium/potassium WNL if concurrent therapy can't be avoided. Marland Kitchen Antibiotics: e.g. fluoroquinolones, erythromycin. . Antiarrhythmics: e.g. quinidine, procainamide, disopyramide, sotalol. . Antipsychotics: e.g. phenothiazines, haloperidol.  . Lithium, tricyclic antidepressants, and methadone. Thank You,    Elicia Lamp, PharmD, BCPS Clinical Pharmacist 10/13/2016 10:39 AM

## 2016-10-13 NOTE — Progress Notes (Signed)
10/13/2016 1430 Dr. Cyndia Bent paged and made aware of persistent low BP despite ordered interventions. Neo at 45, AV paced at 60. Pt. States she feels fine.  Verbal order received to AV pace at 90 and increase Neo as needed to improve BP. Recent labs and UOP reviewed with MD. Will continue to closely monitor patient.  Mackenize Delgadillo, [name]

## 2016-10-13 NOTE — Progress Notes (Signed)
Patient ID: [name], female   DOB: 04-11-1946, 71 y.o.   MRN: [id number] EVENING ROUNDS NOTE :     [address]       [place] [address]             [phone]                 2 Days Post-Op Procedure(s) (LRB): CORONARY ARTERY BYPASS GRAFTING (CABG) times three using left internal mammary artery and right leg saphenous vein (N/A) TRANSESOPHAGEAL ECHOCARDIOGRAM (TEE) (N/A)  Total Length of Stay:  LOS: 2 days  BP (!) 112/57   Pulse 90   Temp 98.3 F (36.8 C) (Oral)   Resp 19   Ht 5\' 4"  (1.626 m)   Wt 152 lb 14.4 oz (69.4 kg)   SpO2 97%   BMI 26.25 kg/m   .Intake/Output      02/06 0701 - 02/07 0700 02/07 0701 - 02/08 0700   P.O. 120 480   I.V. (mL/kg) 673.1 (9.7) 591.7 (8.5)   Blood     IV Piggyback 50 250   Total Intake(mL/kg) 843.1 (12.1) 1321.7 (19)   Urine (mL/kg/hr) 2135 (1.3) 1015 (1.2)   Blood     Chest Tube 20 (0)    Total Output 2155 1015   Net -1311.9 +306.7          . sodium chloride Stopped (10/12/16 1429)  . sodium chloride    . sodium chloride 10 mL/hr at 10/12/16 1800  . amiodarone 30 mg/hr (10/13/16 1800)  . lactated ringers Stopped (10/12/16 1428)  . lactated ringers Stopped (10/11/16 1315)  . phenylephrine (NEO-SYNEPHRINE) Adult infusion 25 mcg/min (10/13/16 1819)     Lab Results  Component Value Date   WBC 6.9 10/13/2016   HGB 9.5 (L) 10/13/2016   HCT 30.2 (L) 10/13/2016   PLT 110 (L) 10/13/2016   GLUCOSE 114 (H) 10/13/2016   CHOL 156 01/13/2016   TRIG 360 (H) 01/13/2016   HDL 46 01/13/2016   LDLCALC 38 01/13/2016   ALT 33 10/08/2016   AST 29 10/08/2016   NA 138 10/13/2016   K 4.3 10/13/2016   CL 101 10/13/2016   CREATININE 0.97 10/13/2016   BUN 9 10/13/2016   CO2 28 10/13/2016   INR 1.30 10/11/2016   HGBA1C 5.6 10/08/2016   Now paced after RVR this am, on amindrione and neo  Grace Isaac MD  Beeper [phone] Office [phone] 10/13/2016 6:50 PM

## 2016-10-13 NOTE — Progress Notes (Signed)
10/13/2016 1730 Per Dr. Cyndia Bent keep foley in place. [place] Flett, [name]

## 2016-10-13 NOTE — Progress Notes (Signed)
Inpatient Diabetes Program Recommendations  AACE/ADA: New Consensus Statement on Inpatient Glycemic Control (2015)  Target Ranges:  Prepandial:   less than 140 mg/dL      Peak postprandial:   less than 180 mg/dL (1-2 hours)      Critically ill patients:  140 - 180 mg/dL   Lab Results  Component Value Date   GLUCAP 97 10/13/2016   HGBA1C 5.6 10/08/2016    Review of Glycemic Control Results for [name], [name] (MRN [id number]) as of 10/13/2016 09:16  Ref. Range 10/12/2016 19:54 10/13/2016 00:10 10/13/2016 00:20 10/13/2016 00:45 10/13/2016 04:07  Glucose-Capillary Latest Ref Range: 65 - 99 mg/dL 158 (H) 57 (L) 56 (L) 74 97    Inpatient Diabetes Program Recommendations:  Spoke with patient @ bedside this am. Patient states she is not feeling well due to nausea and prefers to wait to restart insulin pump. Noted hypoglycemia and decrease in Levemir. Please consider decrease in Novolog correction to 0-9 units q 4 hrs. Will follow.  Thank you, Nani Gasser. Issam Carlyon, RN, MSN, CDE Inpatient Glycemic Control Team Team Pager [phone] (8am-5pm) 10/13/2016 9:23 AM

## 2016-10-13 NOTE — Progress Notes (Addendum)
[address]       [place] [address]             [phone]      2 Days Post-Op Procedure(s) (LRB): CORONARY ARTERY BYPASS GRAFTING (CABG) times three using left internal mammary artery and right leg saphenous vein (N/A) TRANSESOPHAGEAL ECHOCARDIOGRAM (TEE) (N/A)   Subjective:  Patient states she doesn't feel well today.  She complains of nausea.  Objective: Vital signs in last 24 hours: Temp:  [97.8 F (36.6 C)-99.5 F (37.5 C)] 97.8 F (36.6 C) (02/07 0400) Pulse Rate:  [68-87] 76 (02/07 0800) Cardiac Rhythm: Normal sinus rhythm (02/07 0800) Resp:  [7-26] 9 (02/07 0800) BP: (101-136)/(48-68) 114/51 (02/07 0800) SpO2:  [91 %-99 %] 95 % (02/07 0800) Arterial Line BP: (143-176)/(38-61) 143/38 (02/06 1400) Weight:  [152 lb 14.4 oz (69.4 kg)] 152 lb 14.4 oz (69.4 kg) (02/07 0600)  Hemodynamic parameters for last 24 hours: PAP: (41)/(19) 41/19  Intake/Output from previous day: 02/06 0701 - 02/07 0700 In: 843.1 [P.O.:120; I.V.:673.1; IV Piggyback:50] Out: 2155 [Urine:2135; Chest Tube:20] Intake/Output this shift: Total I/O In: 10 [I.V.:10] Out: 25 [Urine:25]  General appearance: alert, cooperative and no distress Heart: regular rate and rhythm Lungs: diminished breath sounds bibasilar Abdomen: soft, non-tender; bowel sounds normal; no masses,  no organomegaly Extremities: edema minimal Wound: clean and dry EVH, aquacel on sternotomy  Lab Results:  Recent Labs  10/12/16 1757 10/12/16 1814 10/13/16 0345  WBC 7.0  --  6.9  HGB 9.8* 9.9* 9.5*  HCT 30.6* 29.0* 30.2*  PLT 115*  --  110*   BMET:  Recent Labs  10/12/16 0333  10/12/16 1814 10/13/16 0345  NA 140  --  138 138  K 4.0  --  4.1 4.3  CL 107  --  100* 101  CO2 25  --   --  28  GLUCOSE 85  --  124* 114*  BUN 12  --  11 9  CREATININE 0.90  < > 1.00 0.97  CALCIUM 8.4*  --   --  8.9  < > = values in this interval not displayed.  PT/INR:  Recent Labs  10/11/16 1611    LABPROT 16.3*  INR 1.30   ABG    Component Value Date/Time   PHART 7.369 10/11/2016 1902   HCO3 24.1 10/11/2016 1902   TCO2 27 10/12/2016 1814   ACIDBASEDEF 1.0 10/11/2016 1902   O2SAT 99.0 10/11/2016 1902   CBG (last 3)   Recent Labs  10/13/16 0020 10/13/16 0045 10/13/16 0407  GLUCAP 56* 74 59    Dg Chest Port 1 View  Result Date: 10/13/2016 CLINICAL DATA:  Post CABG, chest soreness, history diabetes mellitus, hypertension, coronary artery disease, colon cancer EXAM: PORTABLE CHEST 1 VIEW COMPARISON:  Portable exam 0543 hours compared to 10/12/2016 FINDINGS: Interval removal of mediastinal drain, LEFT thoracostomy tube, and Swan-Ganz catheter. RIGHT jugular central venous catheter with tip projecting over SVC. Epicardial pacing wires present. Enlargement of cardiac silhouette post CABG. Pulmonary vascular congestion. Perihilar infiltrates likely pulmonary edema. No gross pleural effusion or pneumothorax. IMPRESSION: No pneumothorax following chest tube removal. Question mild pulmonary edema. Electronically Signed   By: Lavonia Dana M.D.   On: 10/13/2016 08:33   Assessment/Plan: S/P Procedure(s) (LRB): CORONARY ARTERY BYPASS GRAFTING (CABG) times three using left internal mammary artery and right leg saphenous vein (N/A) TRANSESOPHAGEAL ECHOCARDIOGRAM (TEE) (N/A)  1. CV- NSR, Bp controlled, off all drips- continue Lopressor 2. Pulm- no  acute issues, wean oxygen as tolerated, encouraged use of IS 3. Renal- creatinine WNL, weight is up from admission... Give IV Lasix today, resume oral regimen tomorrow, supplement K 4. GI- nausea, continue zofran prn, will add Reglan x 4 doses for additional relief 5. Expected post operative blood loss anemia- mild, Hgb 9.5 6. DM- hypoglycemic overnight, will decrease Levemir to 15 units daily, continue SSIP coverage 7. Dispo- patient doing well, add reglan for nausea, diureses, d/c foley, insulin adjusted... Transfer to 2W   LOS: 2 days     Ellwood Handler 10/13/2016 Not ready to transfer yet I have seen and examined [name] and agree with the above assessment  and plan.  Grace Isaac MD Beeper [phone] Office [phone] 10/13/2016 10:50 AM

## 2016-10-14 ENCOUNTER — Inpatient Hospital Stay (HOSPITAL_COMMUNITY): Payer: Medicare Other

## 2016-10-14 DIAGNOSIS — I480 Paroxysmal atrial fibrillation: Secondary | ICD-10-CM

## 2016-10-14 LAB — BASIC METABOLIC PANEL
Anion gap: 10 (ref 5–15)
BUN: 13 mg/dL (ref 6–20)
CHLORIDE: 99 mmol/L — AB (ref 101–111)
CO2: 27 mmol/L (ref 22–32)
CREATININE: 0.91 mg/dL (ref 0.44–1.00)
Calcium: 8.7 mg/dL — ABNORMAL LOW (ref 8.9–10.3)
GFR calc non Af Amer: >60 mL/min (ref >60)
Glucose, Bld: 157 mg/dL — ABNORMAL HIGH (ref 65–99)
POTASSIUM: 4.3 mmol/L (ref 3.5–5.1)
SODIUM: 136 mmol/L (ref 135–145)

## 2016-10-14 LAB — GLUCOSE, CAPILLARY
Glucose-Capillary: 135 mg/dL — ABNORMAL HIGH (ref 65–99)
Glucose-Capillary: 142 mg/dL — ABNORMAL HIGH (ref 65–99)
Glucose-Capillary: 154 mg/dL — ABNORMAL HIGH (ref 65–99)
Glucose-Capillary: 155 mg/dL — ABNORMAL HIGH (ref 65–99)
Glucose-Capillary: 191 mg/dL — ABNORMAL HIGH (ref 65–99)
Glucose-Capillary: 207 mg/dL — ABNORMAL HIGH (ref 65–99)

## 2016-10-14 LAB — CBC
HEMATOCRIT: 28.5 % — AB (ref 36.0–46.0)
HEMOGLOBIN: 8.8 g/dL — AB (ref 12.0–15.0)
MCH: 27 pg (ref 26.0–34.0)
MCHC: 30.9 g/dL (ref 30.0–36.0)
MCV: 87.4 fL (ref 78.0–100.0)
Platelets: 124 10*3/uL — ABNORMAL LOW (ref 150–400)
RBC: 3.26 MIL/uL — ABNORMAL LOW (ref 3.87–5.11)
RDW: 25.4 % — ABNORMAL HIGH (ref 11.5–15.5)
WBC: 7 10*3/uL (ref 4.0–10.5)

## 2016-10-14 MED ORDER — AMIODARONE HCL IN DEXTROSE 360-4.14 MG/200ML-% IV SOLN
30.0000 mg/h | INTRAVENOUS | Status: AC
  Administered 2016-10-14 – 2016-10-15 (×2): 30 mg/h via INTRAVENOUS
  Filled 2016-10-14 (×3): qty 200

## 2016-10-14 MED ORDER — AMIODARONE LOAD VIA INFUSION
150.0000 mg | Freq: Once | INTRAVENOUS | Status: AC
  Administered 2016-10-14: 150 mg via INTRAVENOUS
  Filled 2016-10-14: qty 83.34

## 2016-10-14 MED ORDER — AMIODARONE HCL 200 MG PO TABS
200.0000 mg | ORAL_TABLET | Freq: Two times a day (BID) | ORAL | Status: DC
  Administered 2016-10-14: 200 mg via ORAL
  Filled 2016-10-14: qty 1

## 2016-10-14 MED ORDER — ORAL CARE MOUTH RINSE
15.0000 mL | Freq: Two times a day (BID) | OROMUCOSAL | Status: DC
  Administered 2016-10-14 – 2016-10-18 (×5): 15 mL via OROMUCOSAL

## 2016-10-14 MED ORDER — AMIODARONE IV BOLUS ONLY 150 MG/100ML
150.0000 mg | Freq: Once | INTRAVENOUS | Status: DC
  Filled 2016-10-14: qty 100

## 2016-10-14 NOTE — Consult Note (Signed)
Reason for Consult: symptomatic tachybrady  Referring Physician: Dr. Ron [name]  [name] is an 71 y.o. female.   HPI: The patient is a 71 yo woman with CAD, s/p CABG several days ago who has had atrial fib with a RVR along with post termination pauses associated with hypotension. She has been placed on IV amiodarone and her temporary pm is pacing AAI at 80/min. She feels well. She denies chest pain. She has not had a h/o syncope in the past. She does not appear to have evidence of conduction system disease.   PMH: Past Medical History:  Diagnosis Date  . Arthritis   . Cancer [facility])    colon cancer  . Coronary artery disease   . Diabetes mellitus without complication ([place])   . GERD (gastroesophageal reflux disease)   . Heart murmur   . History of blood transfusion   . History of kidney stones   . Hyperlipidemia   . Hypertension   . Iron deficiency anemia due to chronic blood loss 09/17/2016  . 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 PONV (postoperative nausea and vomiting)    gets nauseated easily    PSHX: Past Surgical History:  Procedure Laterality Date  . ABDOMINAL HYSTERECTOMY    . APPENDECTOMY    . CARDIAC CATHETERIZATION N/A 09/30/2016   Procedure: Left Heart Cath and Coronary Angiography;  Surgeon: [name] Crome, MD;  Location: [facility] CV LAB;  Service: Cardiovascular;  Laterality: N/A;  . CHOLECYSTECTOMY    . COLONOSCOPY    . CORONARY ARTERY BYPASS GRAFT N/A 10/11/2016   Procedure: CORONARY ARTERY BYPASS GRAFTING (CABG) times three using left internal mammary artery and right leg saphenous vein;  Surgeon: [name] Poot, MD;  Location: [facility];  Service: Open Heart Surgery;  Laterality: N/A;  . TEE WITHOUT CARDIOVERSION N/A 10/11/2016   Procedure: TRANSESOPHAGEAL ECHOCARDIOGRAM (TEE);  Surgeon: [name] Poot, MD;  Location: [facility];  Service: Open Heart Surgery;  Laterality: N/A;    FAMHX: Family History  Problem Relation Age of Onset  . Heart attack Mother   . Diabetes  Mother   . Heart attack Father     Social History:  reports that she has never smoked. She has never used smokeless tobacco. She reports that she does not drink alcohol or use drugs.  Allergies:  Allergies  Allergen Reactions  . Hydrochlorothiazide     Other reaction(s): Other HYPERCALCEMIA  . Losartan Potassium     Other reaction(s): Other Increased eosinophils    Medications: reviewed  Dg Chest Port 1 View  Result Date: 10/14/2016 CLINICAL DATA:  Chest soreness after CABG EXAM: PORTABLE CHEST 1 VIEW COMPARISON:  Portable chest x-ray of 10/13/2016 FINDINGS: The lungs remain poorly aerated. There is little change in cardiomegaly and probable moderate pulmonary vascular congestion. The right venous sheath is unchanged within the SVC. Median sternotomy sutures are noted. IMPRESSION: No change in suboptimal aeration with probable moderate pulmonary vascular congestion and cardiomegaly. Electronically Signed   By: [name] M.D.   On: 10/14/2016 08:25   Dg Chest Port 1 View  Result Date: 10/13/2016 CLINICAL DATA:  Post CABG, chest soreness, history diabetes mellitus, hypertension, coronary artery disease, colon cancer EXAM: PORTABLE CHEST 1 VIEW COMPARISON:  Portable exam 0543 hours compared to 10/12/2016 FINDINGS: Interval removal of mediastinal drain, LEFT thoracostomy tube, and Swan-Ganz catheter. RIGHT jugular central venous catheter with tip projecting over SVC. Epicardial pacing wires present. Enlargement of cardiac silhouette post CABG. Pulmonary vascular congestion. Perihilar infiltrates likely pulmonary  edema. No gross pleural effusion or pneumothorax. IMPRESSION: No pneumothorax following chest tube removal. Question mild pulmonary edema. Electronically Signed   By: [name] M.D.   On: 10/13/2016 08:33    ROS  As stated in the HPI and negative for all other systems.  Physical Exam  Vitals:Blood pressure 98/64, pulse 80, temperature 97.8 F (36.6 C), temperature source  Oral, resp. rate 11, height 5\' 4"  (1.626 m), weight 152 lb 8.9 oz (69.2 kg), SpO2 100 %.  Well appearing 71 yo woman, NAD HEENT: Unremarkable Neck:  No JVD, no thyromegally Lymphatics:  No adenopathy Back:  No CVA tenderness Lungs:  Clear with no wheezes HEART:  Regular rate rhythm, no murmurs, no rubs, no clicks, sternotomy incision is clean and dry Abd:  soft, positive bowel sounds, no organomegally, no rebound, no guarding Ext:  2 plus pulses, no edema, no cyanosis, no clubbing Skin:  No rashes no nodules Neuro:  CN II through XII intact, motor grossly intact  ECG - nsr with atrial pacing with 1:1 AV conduction  CXR - reviewed  Assessment/Plan: 1. Post op atrial fib with a RVR - continue IV amiodarone as her blood pressure will tolerate. Could transition to oral amio in 24 to 48 hours.  2. Symptomatic bradycardia - this is due to post termination pauses while on sinus node slowing drugs. Hopefully her back up pacing will be satisfactory over the next few days. I suspect her long pauses will resolve once she does not have any more atrial fib. If she loses her atrial pacing, we might need to proceed with PPM sooner than later.  3. CAD, s/p CABG - she appears to otherwise be stable after surgery provided her HR above 60.  [name],M.D.  [name] Overlie TaylorMD 10/14/2016, 11:42 PM

## 2016-10-14 NOTE — Progress Notes (Addendum)
[facility] DAILY ICU PROGRESS NOTE                   [address]            [place] [address]          [phone]   3 Days Post-Op Procedure(s) (LRB): CORONARY ARTERY BYPASS GRAFTING (CABG) times three using left internal mammary artery and right leg saphenous vein (N/A) TRANSESOPHAGEAL ECHOCARDIOGRAM (TEE) (N/A)  Total Length of Stay:  LOS: 3 days   Subjective:  No further nausea.  Developed A. Fib yesterday placed on Amiodarone developed bradycardia and long pause.  Placed on backup pacer.  No BM  Objective: Vital signs in last 24 hours: Temp:  [98.3 F (36.8 C)-99.4 F (37.4 C)] 99.4 F (37.4 C) (02/08 0400) Pulse Rate:  [52-134] 90 (02/08 0700) Cardiac Rhythm: Atrial paced (02/08 0400) Resp:  [8-23] 12 (02/08 0700) BP: (73-134)/(45-82) 125/63 (02/08 0700) SpO2:  [88 %-99 %] 98 % (02/08 0700) Weight:  [152 lb 8.9 oz (69.2 kg)] 152 lb 8.9 oz (69.2 kg) (02/08 0500)  Filed Weights   10/12/16 0500 10/13/16 0600 10/14/16 0500  Weight: 155 lb 10.3 oz (70.6 kg) 152 lb 14.4 oz (69.4 kg) 152 lb 8.9 oz (69.2 kg)    Weight change: -5.5 oz (-0.155 kg)   Hemodynamic parameters for last 24 hours:    Intake/Output from previous day: 02/07 0701 - 02/08 0700 In: 2406.4 [P.O.:720; I.V.:1436.4; IV Piggyback:250] Out: 2655 [Urine:2655]  Intake/Output this shift: No intake/output data recorded.  Current Meds: Scheduled Meds: . acetaminophen  1,000 mg Oral Q6H   Or  . acetaminophen (TYLENOL) oral liquid 160 mg/5 mL  1,000 mg Per Tube Q6H  . amiodarone  200 mg Oral BID  . aspirin EC  325 mg Oral Daily   Or  . aspirin  324 mg Per Tube Daily  . atorvastatin  20 mg Oral Daily  . bisacodyl  10 mg Oral Daily   Or  . bisacodyl  10 mg Rectal Daily  . chlorhexidine  15 mL Mouth Rinse BID  . Chlorhexidine Gluconate Cloth  6 each Topical Daily  . cloNIDine  0.2 mg Transdermal Weekly  . docusate sodium  200 mg Oral Daily  . enoxaparin (LOVENOX) injection  30 mg  Subcutaneous QHS  . insulin aspart  0-24 Units Subcutaneous Q4H  . insulin aspart  3 Units Subcutaneous TID WC  . insulin detemir  15 Units Subcutaneous Daily  . mouth rinse  15 mL Mouth Rinse q12n4p  . mupirocin ointment  1 application Nasal BID  . pantoprazole  40 mg Oral Daily  . sodium chloride flush  3 mL Intravenous Q12H  . sodium chloride flush  3 mL Intravenous Q12H   Continuous Infusions: . sodium chloride Stopped (10/12/16 1429)  . sodium chloride    . sodium chloride 10 mL/hr at 10/12/16 1800  . lactated ringers 20 mL/hr at 10/14/16 0400  . lactated ringers Stopped (10/11/16 1315)  . phenylephrine (NEO-SYNEPHRINE) Adult infusion 40 mcg/min (10/14/16 0400)   PRN Meds:.sodium chloride, sodium chloride, diphenhydrAMINE, hydrALAZINE, lactated ringers, metoprolol, morphine injection, ondansetron (ZOFRAN) IV, oxyCODONE, sodium chloride flush, sodium chloride flush, traMADol  General appearance: alert, cooperative and no distress Heart: regular rate and rhythm Lungs: clear to auscultation bilaterally Abdomen: soft, non-tender; bowel sounds normal; no masses,  no organomegaly Extremities: edema none present Wound: clean and dry, aquacel on sternotomy  Lab Results: CBC: Recent Labs  10/13/16 0345 10/14/16  0415  WBC 6.9 7.0  HGB 9.5* 8.8*  HCT 30.2* 28.5*  PLT 110* 124*   BMET:  Recent Labs  10/13/16 0345 10/14/16 0415  NA 138 136  K 4.3 4.3  CL 101 99*  CO2 28 27  GLUCOSE 114* 157*  BUN 9 13  CREATININE 0.97 0.91  CALCIUM 8.9 8.7*    CMET: Lab Results  Component Value Date   WBC 7.0 10/14/2016   HGB 8.8 (L) 10/14/2016   HCT 28.5 (L) 10/14/2016   PLT 124 (L) 10/14/2016   GLUCOSE 157 (H) 10/14/2016   CHOL 156 01/13/2016   TRIG 360 (H) 01/13/2016   HDL 46 01/13/2016   LDLCALC 38 01/13/2016   ALT 33 10/08/2016   AST 29 10/08/2016   NA 136 10/14/2016   K 4.3 10/14/2016   CL 99 (L) 10/14/2016   CREATININE 0.91 10/14/2016   BUN 13 10/14/2016   CO2 27  10/14/2016   INR 1.30 10/11/2016   HGBA1C 5.6 10/08/2016      PT/INR:  Recent Labs  10/11/16 1611  LABPROT 16.3*  INR 1.30   Radiology: No results found.   Assessment/Plan: S/P Procedure(s) (LRB): CORONARY ARTERY BYPASS GRAFTING (CABG) times three using left internal mammary artery and right leg saphenous vein (N/A) TRANSESOPHAGEAL ECHOCARDIOGRAM (TEE) (N/A)  1. CV- A. Fib yesterday, Sinus Brady under pacer today, NEO being weaned as BP tolerates- will d/c IV Amiodarone start oral at 200 mg BID... Hold BB for now 2. Pulm- wean oxygen as tolerated, continue IS 3. Renal- creatinine WNL, weight is up 7lbs, however exam shows no edema...will d/c Lasix with issues of hypotension 4. Expected post operative blood loss anemia- Hgb at 8.8 5. GI- nausea resolved, zofran ordered prn, reglan doses completed 6. DM- sugars are better after decreased in Levemir will monitor and ajust as necessary and oral intake improves 7. Dispo- patient with A. Fib yesterday, required back up pacing due to bradycardia, sinus pause... Currently Sinus [name] will transition IV amiodarone to oral... Hold BB... Also restarted on Neo due to hypotension... Wean this today as BP allows, no Lasix needed at this time... Possibly transfer to 2W later today if can get off drips     [name], [name] 10/14/2016 8:02 AM   A paced now , convert to po cordrone   I have seen and examined [name] and agree with the above assessment  and plan.  Grace Isaac MD Beeper [phone] Office [phone] 10/14/2016 8:46 AM

## 2016-10-14 NOTE — Progress Notes (Signed)
10/14/2016 1400 Dr. Servando Snare paged made aware of continued issues with afib and sinus bradycardia. Verbal order received to page cardiology. Orders enacted. Received callback from [place], [place]. MD to see patient.  [name], [name]

## 2016-10-14 NOTE — Progress Notes (Signed)
10/14/2016 1137 Pt. Back in NSR, bradycardic HR 50, BP 89/45. Pt. Asymptomatic.  Placed back on AAI at 80 for BP support. Will continue to closely monitor patient.  Selena Swaminathan, [name]

## 2016-10-14 NOTE — Consult Note (Signed)
CARDIOLOGY CONSULT NOTE   Patient ID: [name] MRN: [id number] DOB/AGE: 72/05/1946 72 y.o.  Admit date: 10/11/2016  Requesting Physician: Dr. Nils Pyle Primary Physician:   [name], PA-C Primary Cardiologist:   Dr. Johnsie Cancel Reason for Consultation:  afib with RVR  HPI: [name] is a 71 y.o. female with a history of IDDM, anemia, recently diagnosed cecal cancer and 3V CAD who was admitted to [facility] on 10/11/16 for planned CABG x3V. She developed post op afib with RVR as well as bradycardia with a 6 second pause reqiring back up pacemaker and cardiology consulted.   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 She was scheduled for surgery at [facility]. Preoperative cardiac clearance was requested because of a family history of CAD and her diabetes. Stress test was positive for ischemia . Echocardiogram showed normal LV function with mild aortic sclerosis but no stenosis or insufficiency from a thickened valve  Cardiac cath on 09/30/16 showed severe proximal-ostial circumflex stenosis as well as distal left main and proximal LAD stenosis involving the ramus intermediate. LVEDP was normal. It was felt that CABG was the best option for long term treatment of severe CAD and that hemicolectomy could be put off at least 3 months.   She was referred to Dr. Nils Pyle who performed CABG x3V (LIMA--> LAD, SVG--> LCX, SVG--> diag) on 10/11/16. She developed afib with RVR on 10/13/16 and was placed on IV amio. She then converted to NSR with a 6 second post conversion pause. She then had issues with hypotension and bradycardia and lopressor 12.5 mg BID discontinued and amio converted to PO. Given one dose of amio 200mg  this AM and then went back into afib with RVR so amio gtt continued. Then went back in to sinus bradycardia with HR in 40-50s with hypotension and back up pacemaker placed and put back on Neo synephrine. Now being  paced at 6 and feeling okay.   No CP or SOB. No LE edema, orthopnea or PND. No dizziness or syncope. No blood in stool or urine.    Past Medical History:  Diagnosis Date  . Arthritis   . Cancer [facility])    colon cancer  . Coronary artery disease   . Diabetes mellitus without complication ([place])   . GERD (gastroesophageal reflux disease)   . Heart murmur   . History of blood transfusion   . History of kidney stones   . Hyperlipidemia   . Hypertension   . Iron deficiency anemia due to chronic blood loss 09/17/2016  . 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 PONV (postoperative nausea and vomiting)    gets nauseated easily     Past Surgical History:  Procedure Laterality Date  . ABDOMINAL HYSTERECTOMY    . APPENDECTOMY    . CARDIAC CATHETERIZATION N/A 09/30/2016   Procedure: Left Heart Cath and Coronary Angiography;  Surgeon: Belva Crome, MD;  Location: [place] [facility];  Service: Cardiovascular;  Laterality: N/A;  . CHOLECYSTECTOMY    . COLONOSCOPY    . CORONARY ARTERY BYPASS GRAFT N/A 10/11/2016   Procedure: CORONARY ARTERY BYPASS GRAFTING (CABG) times three using left internal mammary artery and right leg saphenous vein;  Surgeon: Ivin Poot, MD;  Location: [place];  Service: Open Heart Surgery;  Laterality: N/A;  . TEE WITHOUT CARDIOVERSION N/A 10/11/2016   Procedure: TRANSESOPHAGEAL ECHOCARDIOGRAM (TEE);  Surgeon: Ivin Poot, MD;  Location: [place];  Service:  Open Heart Surgery;  Laterality: N/A;    Allergies  Allergen Reactions  . Hydrochlorothiazide     Other reaction(s): Other HYPERCALCEMIA  . Losartan Potassium     Other reaction(s): Other Increased eosinophils    I have reviewed the patient's current medications . acetaminophen  1,000 mg Oral Q6H   Or  . acetaminophen (TYLENOL) oral liquid 160 mg/5 mL  1,000 mg Per Tube Q6H  . amiodarone  200 mg Oral BID  . aspirin EC  325 mg Oral Daily   Or  . aspirin  324 mg Per Tube Daily  . atorvastatin  20 mg Oral Daily    . bisacodyl  10 mg Oral Daily   Or  . bisacodyl  10 mg Rectal Daily  . chlorhexidine  15 mL Mouth Rinse BID  . Chlorhexidine Gluconate Cloth  6 each Topical Daily  . cloNIDine  0.2 mg Transdermal Weekly  . docusate sodium  200 mg Oral Daily  . enoxaparin (LOVENOX) injection  30 mg Subcutaneous QHS  . insulin aspart  0-24 Units Subcutaneous Q4H  . insulin aspart  3 Units Subcutaneous TID WC  . insulin detemir  15 Units Subcutaneous Daily  . mouth rinse  15 mL Mouth Rinse q12n4p  . mupirocin ointment  1 application Nasal BID  . pantoprazole  40 mg Oral Daily  . sodium chloride flush  3 mL Intravenous Q12H  . sodium chloride flush  3 mL Intravenous Q12H   . sodium chloride Stopped (10/12/16 1429)  . sodium chloride    . sodium chloride 10 mL/hr at 10/12/16 1800  . amiodarone 30 mg/hr (10/14/16 1400)  . lactated ringers 20 mL/hr at 10/14/16 0400  . lactated ringers Stopped (10/11/16 1315)  . phenylephrine (NEO-SYNEPHRINE) Adult infusion Stopped (10/14/16 0900)   sodium chloride, sodium chloride, diphenhydrAMINE, hydrALAZINE, lactated ringers, metoprolol, morphine injection, ondansetron (ZOFRAN) IV, oxyCODONE, sodium chloride flush, sodium chloride flush, traMADol  Prior to Admission medications   Medication Sig Start Date End Date Taking? Authorizing Provider  Acetaminophen (TYLENOL 8 HOUR ARTHRITIS PAIN PO) Take 2 tablets by mouth every 8 (eight) hours as needed (arthritis).    Yes Historical Provider, MD  amLODipine (NORVASC) 5 MG tablet Take one tablet (5 mg total) by mouth daily. 08/10/16  Yes Jade L Breeback, PA-C  atorvastatin (LIPITOR) 20 MG tablet Take 20 mg by mouth daily.    Yes Historical Provider, MD  Calcium Carbonate-Vitamin D (CALTRATE 600+D PO) Take 1 tablet by mouth daily.    Yes Historical Provider, MD  cloNIDine (CATAPRES) 0.2 MG tablet Take 0.2 mg by mouth 2 (two) times daily.   Yes Historical Provider, MD  Insulin Human (INSULIN PUMP) SOLN Inject 1.5-5.5 each into  the skin 3 (three) times daily. humalog   Yes Historical Provider, MD  Lidocaine-Menthol (ZIMS MAX-FREEZE PAIN RELIEF EX) Apply 1 application topically daily as needed (PAIN).   Yes Historical Provider, MD  metFORMIN (GLUCOPHAGE-XR) 500 MG 24 hr tablet Take two tablets (1,000 mg total) by mouth 2 (two) times daily with meals. 02/04/16  Yes Historical Provider, MD  omeprazole (PRILOSEC) 40 MG capsule Take 40 mg by mouth 2 (two) times daily. 08/25/16  Yes Historical Provider, MD  valsartan (DIOVAN) 160 MG tablet Take one tablet (160 mg total) by mouth daily. 09/28/16  Yes Jade L Breeback, PA-C  Vitamin D, Cholecalciferol, 400 units CAPS Take 800 Units by mouth daily.    Yes Historical Provider, MD  Icosapent Ethyl 1 g CAPS Take 2 g  by mouth 2 (two) times daily. Patient not taking: Reported on 10/07/2016 01/19/16   Donella Stade, PA-C     Social History   Social History  . Marital status: Married    Spouse name: N/A  . Number of children: N/A  . Years of education: N/A   Occupational History  . Not on file.   Social History Main Topics  . Smoking status: Never Smoker  . Smokeless tobacco: Never Used  . Alcohol use No  . Drug use: No  . Sexual activity: No   Other Topics Concern  . Not on file   Social History Narrative  . No narrative on file    Family Status  Relation Status  . Mother Deceased  . Father Deceased   Family History  Problem Relation Age of Onset  . Heart attack Mother   . Diabetes Mother   . Heart attack Father     ROS:  Full 14 point review of systems complete and found to be negative unless listed above.  Physical Exam: Blood pressure (!) 93/55, pulse 80, temperature 98.7 F (37.1 C), temperature source Oral, resp. rate 17, height 5\' 4"  (1.626 m), weight 152 lb 8.9 oz (69.2 kg), SpO2 100 %.  General: Well developed, well nourished, female in no acute distress Head: Eyes PERRLA, No xanthomas.   Normocephalic and atraumatic, oropharynx without edema or  exudate.  Lungs: CTAB Heart: HRRR S1 S2, no rub/gallop, Heart regular rate and rhythm with S1, S2 no  murmur. pulses are 2+ extrem.   Neck: No carotid bruits. No lymphadenopathy.   No JVD. Abdomen: Bowel sounds present, abdomen soft and non-tender without masses or hernias noted. Msk:  No spine or cva tenderness. No weakness, no joint deformities or effusions. Extremities: No clubbing or cyanosis. No LE edema.  Neuro: Alert and oriented X 3. No focal deficits noted. Psych:  Good affect, responds appropriately Skin: No rashes or lesions noted.  Labs:   Lab Results  Component Value Date   WBC 7.0 10/14/2016   HGB 8.8 (L) 10/14/2016   HCT 28.5 (L) 10/14/2016   MCV 87.4 10/14/2016   PLT 124 (L) 10/14/2016    Recent Labs  10/11/16 1611  INR 1.30    Recent Labs Lab 10/08/16 1419  10/14/16 0415  NA 136  < > 136  K 4.8  < > 4.3  CL 109  < > 99*  CO2 19*  < > 27  BUN 22*  < > 13  CREATININE 1.11*  < > 0.91  CALCIUM 9.7  < > 8.7*  PROT 6.4*  --   --   BILITOT 0.3  --   --   ALKPHOS 128*  --   --   ALT 33  --   --   AST 29  --   --   GLUCOSE 183*  < > 157*  ALBUMIN 3.8  --   --   < > = values in this interval not displayed. Magnesium  Date Value Ref Range Status  10/12/2016 2.0 1.7 - 2.4 mg/dL Final   No results for input(s): CKTOTAL, CKMB, TROPONINI in the last 72 hours. No results for input(s): TROPIPOC in the last 72 hours. No results found for: PROBNP Lab Results  Component Value Date   CHOL 156 01/13/2016   HDL 46 01/13/2016   LDLCALC 38 01/13/2016   TRIG 360 (H) 01/13/2016   No results found for: DDIMER Lipase  Date/Time Value Ref Range Status  01/13/2016 08:42 AM 44 7 - 60 U/L Final   No results found for: TSH, T4TOTAL, T3FREE, THYROIDAB Ferritin  Date/Time Value Ref Range Status  10/01/2016 09:35 AM 253 9 - 269 ng/ml Final   TIBC  Date/Time Value Ref Range Status  10/01/2016 09:35 AM 360 236 - 444 ug/dL Final   Iron  Date/Time Value Ref Range  Status  10/01/2016 09:35 AM 103 41 - 142 ug/dL Final    Echo: 09/30/2016 LV EF: 60% -   65% Study Conclusions - Left ventricle: The cavity size was normal. Wall thickness was   normal. Systolic function was normal. The estimated ejection   fraction was in the range of 60% to 65%. Doppler parameters are   consistent with abnormal left ventricular relaxation (grade 1   diastolic dysfunction).   ECG:  Atrial fibrillation with rapid ventricular response HR 160  Radiology:  Dg Chest Port 1 View  Result Date: 10/14/2016 CLINICAL DATA:  Chest soreness after CABG EXAM: PORTABLE CHEST 1 VIEW COMPARISON:  Portable chest x-ray of 10/13/2016 FINDINGS: The lungs remain poorly aerated. There is little change in cardiomegaly and probable moderate pulmonary vascular congestion. The right venous sheath is unchanged within the SVC. Median sternotomy sutures are noted. IMPRESSION: No change in suboptimal aeration with probable moderate pulmonary vascular congestion and cardiomegaly. Electronically Signed   By: Ivar Drape M.D.   On: 10/14/2016 08:25   Dg Chest Port 1 View  Result Date: 10/13/2016 CLINICAL DATA:  Post CABG, chest soreness, history diabetes mellitus, hypertension, coronary artery disease, colon cancer EXAM: PORTABLE CHEST 1 VIEW COMPARISON:  Portable exam 0543 hours compared to 10/12/2016 FINDINGS: Interval removal of mediastinal drain, LEFT thoracostomy tube, and Swan-Ganz catheter. RIGHT jugular central venous catheter with tip projecting over SVC. Epicardial pacing wires present. Enlargement of cardiac silhouette post CABG. Pulmonary vascular congestion. Perihilar infiltrates likely pulmonary edema. No gross pleural effusion or pneumothorax. IMPRESSION: No pneumothorax following chest tube removal. Question mild pulmonary edema. Electronically Signed   By: Lavonia Dana M.D.   On: 10/13/2016 08:33    ASSESSMENT AND PLAN:    Active Problems:   S/P CABG x 3   Jiada Girgenti is a 71 y.o. female  with a history of IDDM, anemia, recently diagnosed cecal cancer and 3V CAD who was admitted to [facility] on 10/11/16 for planned CABG x3V. She developed post op afib with RVR as well as bradycardia with a 6 second pause reqiring back up pacemaker and cardiology consulted.   Tachy- brady: had post op afib with RVR with HRs up to 150-160 in afib and down into 40s with sinus rates. Had a 6 seconds post conversion pauses. Currently pacing ~80 bpm. Underlying rhythm sinus brady HR in 50-60s currently when we turned pacer off. Currently on IV amiodarone ( tried to convert to PO this AM with overlap but went back into afib with RVR , so was given another 150mg  IV bolus and kept on gtt). Lopressor 12.5 mg BID discontinued due to hypotension. Would keep on IV amio for now.   Hypotension: I had RN take off clonidine patch. Last BP 105/61. Back on neo synephrine   CAD s/p CABG x3 V: continue ASA and statin   Cecal cancer: prognosis good per GI MD. Planned hemicolectomy in about 3 months after she recovers from bypass surgery   Signed: Angelena Form, PA-C 10/14/2016 2:56 PM  Pager [phone]  EP Attending  Patient seen and examined. See my note. She has symptomatic tachy brady  after CABG but is tolerating atrial pacing. If her rhythm remains stable, would continue amio and transition to tele. If she requires ongoing atrial pacing then she will need PPM insertion. Her hemodynamics are much improved at this point.  Mikle Bosworth.D.

## 2016-10-14 NOTE — Progress Notes (Addendum)
TCTS BRIEF SICU PROGRESS NOTE  3 Days Post-Op  S/P Procedure(s) (LRB): CORONARY ARTERY BYPASS GRAFTING (CABG) times three using left internal mammary artery and right leg saphenous vein (N/A) TRANSESOPHAGEAL ECHOCARDIOGRAM (TEE) (N/A)   Stable day although still w/ occasional bursts of PAF Currently AAI paced w/ stable BP Breathing comfortably w/ O2 sats  97% UOP adequate  Plan: Continue current plan  Rexene Alberts, MD 10/14/2016 7:14 PM

## 2016-10-14 NOTE — Progress Notes (Signed)
10/14/2016 0930 Noted pt. Back in afib rvr. Pt. Already taken oral amiodarone. Junie Panning Barrett PAC paged and made aware. Orders to observe patient for now. Will continue to closely monitor patient.  Bernice Mcauliffe, [facility] paged and made aware of persistent afib rvr. Pt. Asymptomatic. VSS. HR 130-140. Verbal order received to give 150 mg amiodarone iv bolus and continue amiodarone gtt for now. PA to consult cardiology. Patient updated on plan of care. Will continue to closely monitor patient. Jacobb Alen, [name]

## 2016-10-15 ENCOUNTER — Ambulatory Visit: Payer: Medicare Other | Admitting: Family

## 2016-10-15 ENCOUNTER — Other Ambulatory Visit: Payer: Medicare Other

## 2016-10-15 ENCOUNTER — Ambulatory Visit: Payer: Medicare Other

## 2016-10-15 ENCOUNTER — Inpatient Hospital Stay (HOSPITAL_COMMUNITY): Payer: Medicare Other

## 2016-10-15 DIAGNOSIS — I480 Paroxysmal atrial fibrillation: Secondary | ICD-10-CM

## 2016-10-15 LAB — GLUCOSE, CAPILLARY
Glucose-Capillary: 108 mg/dL — ABNORMAL HIGH (ref 65–99)
Glucose-Capillary: 112 mg/dL — ABNORMAL HIGH (ref 65–99)
Glucose-Capillary: 141 mg/dL — ABNORMAL HIGH (ref 65–99)
Glucose-Capillary: 165 mg/dL — ABNORMAL HIGH (ref 65–99)
Glucose-Capillary: 174 mg/dL — ABNORMAL HIGH (ref 65–99)
Glucose-Capillary: 206 mg/dL — ABNORMAL HIGH (ref 65–99)

## 2016-10-15 LAB — BASIC METABOLIC PANEL
Anion gap: 8 (ref 5–15)
BUN: 16 mg/dL (ref 6–20)
CHLORIDE: 100 mmol/L — AB (ref 101–111)
CO2: 29 mmol/L (ref 22–32)
CREATININE: 0.96 mg/dL (ref 0.44–1.00)
Calcium: 8.6 mg/dL — ABNORMAL LOW (ref 8.9–10.3)
GFR calc non Af Amer: 58 mL/min — ABNORMAL LOW (ref >60)
Glucose, Bld: 130 mg/dL — ABNORMAL HIGH (ref 65–99)
Potassium: 4 mmol/L (ref 3.5–5.1)
SODIUM: 137 mmol/L (ref 135–145)

## 2016-10-15 MED ORDER — INSULIN ASPART 100 UNIT/ML ~~LOC~~ SOLN
0.0000 [IU] | Freq: Three times a day (TID) | SUBCUTANEOUS | Status: DC
  Administered 2016-10-15 (×2): 4 [IU] via SUBCUTANEOUS
  Administered 2016-10-15: 8 [IU] via SUBCUTANEOUS
  Administered 2016-10-15 – 2016-10-16 (×2): 2 [IU] via SUBCUTANEOUS
  Administered 2016-10-16 (×2): 4 [IU] via SUBCUTANEOUS
  Administered 2016-10-17: 2 [IU] via SUBCUTANEOUS
  Administered 2016-10-17 (×2): 4 [IU] via SUBCUTANEOUS
  Administered 2016-10-18 (×3): 2 [IU] via SUBCUTANEOUS
  Administered 2016-10-18: 4 [IU] via SUBCUTANEOUS
  Administered 2016-10-19 (×2): 2 [IU] via SUBCUTANEOUS

## 2016-10-15 MED ORDER — ASPIRIN EC 81 MG PO TBEC
81.0000 mg | DELAYED_RELEASE_TABLET | Freq: Every day | ORAL | Status: DC
  Administered 2016-10-15 – 2016-10-19 (×5): 81 mg via ORAL
  Filled 2016-10-15 (×5): qty 1

## 2016-10-15 MED ORDER — SODIUM CHLORIDE 0.9 % IV SOLN: 250.0000 mL | INTRAVENOUS | Status: DC | PRN

## 2016-10-15 MED ORDER — DILTIAZEM HCL ER 60 MG PO CP12
60.0000 mg | ORAL_CAPSULE | Freq: Two times a day (BID) | ORAL | Status: DC
  Administered 2016-10-15 – 2016-10-19 (×9): 60 mg via ORAL
  Filled 2016-10-15 (×9): qty 1

## 2016-10-15 MED ORDER — POTASSIUM CHLORIDE CRYS ER 20 MEQ PO TBCR
20.0000 meq | EXTENDED_RELEASE_TABLET | Freq: Every day | ORAL | Status: DC
  Administered 2016-10-15 – 2016-10-17 (×3): 20 meq via ORAL
  Filled 2016-10-15 (×3): qty 1

## 2016-10-15 MED ORDER — FUROSEMIDE 40 MG PO TABS
40.0000 mg | ORAL_TABLET | Freq: Every day | ORAL | Status: DC
  Administered 2016-10-15 – 2016-10-17 (×3): 40 mg via ORAL
  Filled 2016-10-15 (×3): qty 1

## 2016-10-15 MED ORDER — AMIODARONE HCL 200 MG PO TABS
400.0000 mg | ORAL_TABLET | Freq: Two times a day (BID) | ORAL | Status: DC
  Administered 2016-10-15 – 2016-10-17 (×6): 400 mg via ORAL
  Filled 2016-10-15 (×7): qty 2

## 2016-10-15 MED ORDER — SODIUM CHLORIDE 0.9% FLUSH: 3.0000 mL | Freq: Two times a day (BID) | INTRAVENOUS | Status: DC

## 2016-10-15 MED ORDER — SODIUM CHLORIDE 0.9% FLUSH: 3.0000 mL | INTRAVENOUS | Status: DC | PRN

## 2016-10-15 MED ORDER — MOVING RIGHT ALONG BOOK
Freq: Once | Status: AC
  Administered 2016-10-15: 09:00:00
  Filled 2016-10-15: qty 1

## 2016-10-15 NOTE — Progress Notes (Signed)
SUBJECTIVE:  The patient is doing well today.  At this time, she denies chest pain, shortness of breath, or any new concerns.  She is out of ICU  . acetaminophen  1,000 mg Oral Q6H   Or  . acetaminophen (TYLENOL) oral liquid 160 mg/5 mL  1,000 mg Per Tube Q6H  . amiodarone  400 mg Oral BID  . aspirin EC  81 mg Oral Daily  . atorvastatin  20 mg Oral Daily  . bisacodyl  10 mg Oral Daily   Or  . bisacodyl  10 mg Rectal Daily  . Chlorhexidine Gluconate Cloth  6 each Topical Daily  . diltiazem  60 mg Oral Q12H  . docusate sodium  200 mg Oral Daily  . enoxaparin (LOVENOX) injection  30 mg Subcutaneous QHS  . furosemide  40 mg Oral Daily  . insulin aspart  0-24 Units Subcutaneous TID AC & HS  . insulin detemir  15 Units Subcutaneous Daily  . mouth rinse  15 mL Mouth Rinse BID  . mupirocin ointment  1 application Nasal BID  . pantoprazole  40 mg Oral Daily  . potassium chloride  20 mEq Oral Daily  . sodium chloride flush  3 mL Intravenous Q12H   . sodium chloride Stopped (10/12/16 1429)  . sodium chloride    . sodium chloride 10 mL/hr at 10/12/16 1800  . amiodarone 30 mg/hr (10/15/16 0700)  . lactated ringers 20 mL/hr at 10/15/16 0700  . lactated ringers Stopped (10/11/16 1315)    OBJECTIVE: Physical Exam: Vitals:   10/15/16 0702 10/15/16 0720 10/15/16 0800 10/15/16 0900  BP:   (!) 119/56   Pulse: 80  71 72  Resp: 17  17 17   Temp:  97.4 F (36.3 C)    TempSrc:  Oral    SpO2: 100%  100% 100%  Weight:      Height:        Intake/Output Summary (Last 24 hours) at 10/15/16 1022 Last data filed at 10/15/16 0900  Gross per 24 hour  Intake           1404.9 ml  Output             1975 ml  Net           -570.1 ml    Telemetry is reviewed by myself: A paced/Vsensed, episode of PAF w/RVR this morning.    GEN- The patient is well appearing, alert and oriented x 3 today.   Head- normocephalic, atraumatic Eyes-  Sclera clear, conjunctiva pink Ears- hearing  intact Oropharynx- clear Neck- supple, no JVP Lungs- CTA b/l, normal work of breathing Heart- RRR, no significant murmurs, no rubs or gallops GI- soft, NT, ND Extremities- no clubbing, cyanosis, no edema Skin- no rash or lesion Psych- euthymic mood, full affect Neuro- no gross deficits appreciated  LABS: Basic Metabolic Panel:  Recent Labs  10/12/16 1757  10/14/16 0415 10/15/16 0355  NA  --   < > 136 137  K  --   < > 4.3 4.0  CL  --   < > 99* 100*  CO2  --   < > 27 29  GLUCOSE  --   < > 157* 130*  BUN  --   < > 13 16  CREATININE 1.01*  < > 0.91 0.96  CALCIUM  --   < > 8.7* 8.6*  MG 2.0  --   --   --   < > = values in this  interval not displayed. CBC:  Recent Labs  10/13/16 0345 10/14/16 0415  WBC 6.9 7.0  HGB 9.5* 8.8*  HCT 30.2* 28.5*  MCV 86.8 87.4  PLT 110* 124*    RADIOLOGY: Dg Chest Port 1 View Result Date: 10/15/2016 CLINICAL DATA:  Bypass surgery. EXAM: PORTABLE CHEST 1 VIEW COMPARISON:  Multiple prior chest x-rays. FINDINGS: The right IJ Cordis is stable. Stable mild cardiac enlargement and tortuosity and calcification of the thoracic aorta. Improved lung aeration with resolving edema and atelectasis. No definite pleural effusions. IMPRESSION: Improved lung aeration with resolving edema, atelectasis and effusions. No pneumothorax. Electronically Signed   By: Marijo Sanes M.D.   On: 10/15/2016 08:43     ASSESSMENT AND PLAN:   1. Post-op AFib w/RVR      symptomatic post termination pause 6 seconds with IV amiodarone >> PO >> Rapid AFb >> amio gtt with SB >> temp pacer (AAI) and Neo synepherine.      Currently getting:     Diltiazem 60mg  Q12     Amiodarone 400mg  BID (gtt planned to stop this afternoon)     LR     Neo synephrine off   No change for now, continue amiodarone  2. CAD     S/p CABG 10/11/16  3 Adenocarcinoma cecum    Pending hemicolectomy (has been postponed electively given finding of severe CAD by outpatient work-up)  Tommye Standard,  PA-C 10/15/2016 10:22 AM  I have seen and examined this patient with Tommye Standard.  Agree with above, note added to reflect my findings.  On exam, regular rhythm, no murmurs, lungs clear. Currently s.p CABG with atrial fibrillation and rapid rates. Started on amiodarone and having conversion pauses. Would plan to continue amiodarone at current doses. Hopefully she Lonna Rabold stay in sinus rhythm and thus avoid pacemaker placement.    Joevanni Roddey M. Reonna Finlayson MD 10/15/2016 6:54 PM

## 2016-10-15 NOTE — Progress Notes (Signed)
Patient has arrived on unit from [place] Patient oriented to unit, assessed, placed on tele, VS were stable.

## 2016-10-15 NOTE — Progress Notes (Signed)
Inpatient Diabetes Program Recommendations  AACE/ADA: New Consensus Statement on Inpatient Glycemic Control (2015)  Target Ranges:  Prepandial:   less than 140 mg/dL      Peak postprandial:   less than 180 mg/dL (1-2 hours)      Critically ill patients:  140 - 180 mg/dL   Lab Results  Component Value Date   GLUCAP 206 (H) 10/15/2016   HGBA1C 5.6 10/08/2016    Review of Glycemic Control Results for AMAND, BAYSINGER (MRN [id number]) as of 10/15/2016 12:28  Ref. Range 10/14/2016 19:40 10/15/2016 00:05 10/15/2016 04:15 10/15/2016 07:18 10/15/2016 11:15  Glucose-Capillary Latest Ref Range: 65 - 99 mg/dL 154 (H) 112 (H) 108 (H) 174 (H) 206 (H)   Inpatient Diabetes Program Recommendations:  Spoke with patient and family @ bedside. Patient said husband plans to bring insulin pump supplies tomorrow. Patient to alert nurse when supplies arrive and patient will need MD order to switch to insulin pump, print insulin pump contract and keep insulin pump flowsheet in EPIC.  Will also need to coordinate timing with basal insulin timing when insulin pump started.  Thank you, Nani Gasser. Ivry Pigue, RN, MSN, CDE Inpatient Glycemic Control Team Team Pager [phone] (8am-5pm) 10/15/2016 12:34 PM

## 2016-10-15 NOTE — Discharge Summary (Signed)
Physician Discharge Summary  Patient ID: [name] MRN: [id number] DOB/AGE: 72-Mar-1947 72 y.o.  Admit date: 10/11/2016 Discharge date: 10/19/2016  Admission Diagnoses:  Patient Active Problem List   Diagnosis Date Noted  . Coronary artery disease involving native coronary artery of native heart without angina pectoris   . Iron deficiency anemia due to chronic blood loss 09/17/2016  . 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 Low hemoglobin 09/16/2016  . Malignant neoplasm of ascending colon ([place]) 09/15/2016  . Gastroesophageal reflux disease without esophagitis 09/15/2016  . Gastric ulcer 08/27/2016  . Constipation 05/18/2016  . Chronic liver disease 02/13/2016  . Hypertriglyceridemia 01/15/2016  . CKD (chronic kidney disease), stage III 01/15/2016  . Type 2 diabetes mellitus with complication, with long-term current use of insulin ([place]) 01/14/2016  . Mixed hyperlipidemia 01/14/2016  . Muscle cramps 01/14/2016  . Other osteoarthritis involving multiple joints 01/14/2016   Discharge Diagnoses:   Patient Active Problem List   Diagnosis Date Noted  . Paroxysmal atrial fibrillation ([place]) 10/15/2016  . S/P CABG x 3 10/11/2016  . Coronary artery disease involving native coronary artery of native heart without angina pectoris   . Iron deficiency anemia due to chronic blood loss 09/17/2016  . 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 Low hemoglobin 09/16/2016  . Malignant neoplasm of ascending colon ([place]) 09/15/2016  . Gastroesophageal reflux disease without esophagitis 09/15/2016  . Gastric ulcer 08/27/2016  . Constipation 05/18/2016  . Chronic liver disease 02/13/2016  . Hypertriglyceridemia 01/15/2016  . CKD (chronic kidney disease), stage III 01/15/2016  . Type 2 diabetes mellitus with complication, with long-term current use of insulin ([place]) 01/14/2016  . Mixed hyperlipidemia 01/14/2016  . Muscle cramps 01/14/2016  . Other osteoarthritis involving multiple joints 01/14/2016   Discharged  Condition: good  History of Present Illness:  [name] is a 71 yo white female with history of diabetes on an insulin pump and Adenocarcinoma of the cecum.  She was scheduled to have surgery at [facility], however due to her having a family history of CAD, cardiac clearance was requested prior to surgery.  She underwent a stress test which was positive for ischemia.  Subsequently she had cardiac catheterization which showed multivessel CAD.  Echocardiogram was also performed and there was no valvular abnormalities noted.  It was felt coronary bypass grafting would be indicated and she was referred to [facility] for surgical evaluation.  She was evaluated by [name] on 10/06/2016 at which time he was in agreement she would require coronary bypass grafting procedure.  He explained to the patient that she would be required to wait 3 months after surgery prior to proceeding with hemicolectomy.  The patient states that her Gastroenterologist felt her disease was stable and would be okay to have her CABG done first.  The risks and benefits of the procedure were explained to the patient and her family and she was agreeable to proceed with surgery.    Hospital Course:   [name] presented to [facility] on 10/11/2016.  She was taken to the operating room and underwent CABG x 3 utilizing LIMA to LAD, SVG to Diagonal, and SVG to OM1.  She also underwent Endoscopic harvest of greater saphenous vein from right leg.  She tolerated the procedure without difficulty and was taken to the SICU in stable condition.  The patient was extubated the evening of surgery.  During his stay in the SICU the patient was weaned off Milrinone on POD #1.  Her chest tubes and arterial lines  were removed without difficulty.  She developed post operative nausea which responded well to anti-emetics.  She was maintaining NSR and felt medically stable for transfer to the floor on POD #2.  However, prior to transfer the patient  developed rapid Atrial Fibrillation.  She was placed on Amiodarone and ultimately converted to Sinus Bradycardia.  She also developed sinus pauses and was placed back on temporary pacing.  She became hypotensive after administration of amiodarone and IV Lasix and required start of Neo-synephrine drip.  The patient converted to NSR and was able to be weaned off Neo-synephrine.  She was transitioned to an oral regimen of Amiodarone.  However, she again developed Rapid Atrial Fibrillation.  She was again bolused with IV Amiodarone.  Due to persistent A. Fib with underlying sinus bradycardia and sinus pauses, Cardiology consult was obtained and the EP physician was hopeful her underlying sinus pauses would resolve.  We were to continued with Amiodarone, however should patient's temporary pacing fail she would require placement of a PPM should issues not resolve.  The patient converted to NSR and was transitioned off IV Amiodarone.  She remained on oral Amiodarone and was also placed on Cardizem per [name].  She was transferred to the step down unit on POD #4. The patient continued to make progress.  She continue to have episodes of Sinus Pauses with intermittent A. Fib.  She is not a candidate for coumadin with her cecal mass.  Her rhythm had stabilized and her pacing wires were removed.  She has responded well to diuresis and is back at her baseline weight.  She will not require Lasix at discharge.  Her creatinine is 1.31 and she will require follow up.  She is maintaining NSR.  She is ambulating without difficulty.  She is felt medically stable for discharge home today.            Consults: cardiology  Significant Diagnostic Studies: angiography:    LM lesion, 60 %stenosed.  Ost Cx lesion, 75 %stenosed.  Mid LAD lesion, 70 %stenosed.  The left ventricular systolic function is normal.  The left ventricular ejection fraction is 55-65% by visual estimate.    50-60% distal left main terminating on  a quadrification with LAD, first diagonal, ramus, and circumflex. Heavy calcification  Ostial 75% circumflex stenosis.  70-75% proximal LAD.  Normal LV function with EF 65%.  Treatments: surgery:   CORONARY ARTERY BYPASS GRAFTING x 3 -LIMA to LAD -SVG to DIAGONAL -SVG to OM1  ENDOSCOPIC HARVEST GREATER SAPHENOUS VEIN -Right Thigh  Disposition: 01-Home or Self Care   Discharge Medications:  The patient has been discharged on:   1.Beta Blocker:  Yes [   ]                              No   [x   ]                              If No, reason: Sinus Bradycardia, pauses  2.Ace Inhibitor/ARB: Yes [x   ]                                     No  [    ]  If No, reason:  3.Statin:   Yes [ x  ]                  No  [   ]                  If No, reason:  4.Ecasa:  Yes  [x  ]                  No   [   ]                  If No, reason:   Allergies as of 10/19/2016      Reactions   Hydrochlorothiazide    Other reaction(s): Other HYPERCALCEMIA   Losartan Potassium    Other reaction(s): Other Increased eosinophils      Medication List    STOP taking these medications   amLODipine 5 MG tablet Commonly known as:  NORVASC   cloNIDine 0.2 MG tablet Commonly known as:  CATAPRES     TAKE these medications   amiodarone 200 MG tablet Commonly known as:  PACERONE Take 1 tablet (200 mg total) by mouth 2 (two) times daily.   aspirin 81 MG EC tablet Take 1 tablet (81 mg total) by mouth daily. Start taking on:  10/20/2016   atorvastatin 20 MG tablet Commonly known as:  LIPITOR Take 20 mg by mouth daily.   CALTRATE 600+D PO Take 1 tablet by mouth daily.   diltiazem 60 MG 12 hr capsule Commonly known as:  CARDIZEM SR Take 1 capsule (60 mg total) by mouth every 12 (twelve) hours.   Icosapent Ethyl 1 g Caps Take 2 g by mouth 2 (two) times daily.   insulin pump Soln Inject 1.5-5.5 each into the skin 3 (three) times daily. humalog    metFORMIN 500 MG 24 hr tablet Commonly known as:  GLUCOPHAGE-XR Take two tablets (1,000 mg total) by mouth 2 (two) times daily with meals.   omeprazole 40 MG capsule Commonly known as:  PRILOSEC Take 40 mg by mouth 2 (two) times daily.   traMADol 50 MG tablet Commonly known as:  ULTRAM Take 1-2 tablets (50-100 mg total) by mouth every 4 (four) hours as needed for moderate pain.   TYLENOL 8 HOUR ARTHRITIS PAIN PO Take 2 tablets by mouth every 8 (eight) hours as needed (arthritis).   valsartan 40 MG tablet Commonly known as:  DIOVAN Take 1 tablet (40 mg total) by mouth daily. What changed:  See the new instructions.   Vitamin D (Cholecalciferol) 400 units Caps Take 800 Units by mouth daily.   ZIMS MAX-FREEZE PAIN RELIEF EX Apply 1 application topically daily as needed (PAIN).      Follow-up Information    Tharon Aquas Trigt III, MD Follow up on 11/17/2016.   Specialty:  Cardiothoracic Surgery Why:  Appointment is at 3:00, please get CXR at 2:30 [facility] imaging located on first floor of our office building Contact information: [address] [place] [address] [phone]           Signed: Ellwood Handler 10/19/2016, 11:05 AM

## 2016-10-15 NOTE — Progress Notes (Signed)
4 Days Post-Op Procedure(s) (LRB): CORONARY ARTERY BYPASS GRAFTING (CABG) times three using left internal mammary artery and right leg saphenous vein (N/A) TRANSESOPHAGEAL ECHOCARDIOGRAM (TEE) (N/A) Subjective: Currently nsr- burst of afib earlier Not candidate for coumadin due to active GI bleeding- colon Ca Will transition to po amio and transfer to stepdown Objective: Vital signs in last 24 hours: Temp:  [97.4 F (36.3 C)-98.7 F (37.1 C)] 97.4 F (36.3 C) (02/09 0720) Pulse Rate:  [61-138] 80 (02/09 0702) Cardiac Rhythm: Atrial paced (02/09 0702) Resp:  [10-22] 17 (02/09 0702) BP: (87-144)/(48-86) 96/56 (02/09 0700) SpO2:  [95 %-100 %] 100 % (02/09 0702) Weight:  [153 lb 10.6 oz (69.7 kg)] 153 lb 10.6 oz (69.7 kg) (02/09 0400)  Hemodynamic parameters for last 24 hours:  stable  Intake/Output from previous day: 02/08 0701 - 02/09 0700 In: 1296.6 [P.O.:480; I.V.:816.6] Out: 2325 [Urine:2325] Intake/Output this shift:        Exam    General- alert and comfortable   Lungs- clear without rales, wheezes   Cor- regular rate and rhythm, no murmur , gallop   Abdomen- soft, non-tender   Extremities - warm, non-tender, minimal edema   Neuro- oriented, appropriate, no focal weakness    Lab Results:  Recent Labs  10/13/16 0345 10/14/16 0415  WBC 6.9 7.0  HGB 9.5* 8.8*  HCT 30.2* 28.5*  PLT 110* 124*   BMET:  Recent Labs  10/14/16 0415 10/15/16 0355  NA 136 137  K 4.3 4.0  CL 99* 100*  CO2 27 29  GLUCOSE 157* 130*  BUN 13 16  CREATININE 0.91 0.96  CALCIUM 8.7* 8.6*    PT/INR: No results for input(s): LABPROT, INR in the last 72 hours. ABG    Component Value Date/Time   PHART 7.369 10/11/2016 1902   HCO3 24.1 10/11/2016 1902   TCO2 27 10/12/2016 1814   ACIDBASEDEF 1.0 10/11/2016 1902   O2SAT 99.0 10/11/2016 1902   CBG (last 3)   Recent Labs  10/15/16 0005 10/15/16 0415 10/15/16 0718  GLUCAP 112* 108* 174*    Assessment/Plan: S/P  Procedure(s) (LRB): CORONARY ARTERY BYPASS GRAFTING (CABG) times three using left internal mammary artery and right leg saphenous vein (N/A) TRANSESOPHAGEAL ECHOCARDIOGRAM (TEE) (N/A) Mobilize Plan for transfer to step-down: see transfer orders po amiodarone   LOS: 4 days    [name] 10/15/2016

## 2016-10-15 NOTE — Progress Notes (Signed)
At thsi time pt appears to have converted back to an atrial paced rhythm on the monitor. Oncoming nurse aware.  Sherlie Ban, RN

## 2016-10-15 NOTE — Progress Notes (Signed)
Pt ambulated 300 ft this AM; at the end of ambulation pt's rhythm converted to Afib RVR. Pt states that she can feel her heart racing but not as bad as previous episodes. Pt hemodynamically stable, BP 134/76. Pt receiving 30 mg/hr amiodarone infusion; given 5mg  PRN IV lopressor; external pacer settings same. Will pass information to oncoming RN.  Sherlie Ban, RN

## 2016-10-15 NOTE — Progress Notes (Signed)
CARDIAC REHAB PHASE I   PRE:  Rate/Rhythm: 72 pacing    BP: sitting 124/52    SaO2: 100 2L, 97 RA  MODE:  Ambulation: 350 ft   POST:  Rate/Rhythm: 86 with some paced beats    BP: sitting 119/55     SaO2: 100 2L  Pt eager to walk. Used RW and 2L O2 to help with afib. Steady, slow pace. No c/o. She did have beats to override her pacer. Asx. Return to bed and will get up later. Very motivated. 1000 mL on IS. Could be without O2 at rest. [id number]   [place], ACSM 10/15/2016 3:12 PM

## 2016-10-15 NOTE — Discharge Instructions (Signed)
Coronary Artery Bypass Grafting, Care After °Refer to this sheet in the next few weeks. These instructions provide you with information on caring for yourself after your procedure. Your health care provider may also give you more specific instructions. Your treatment has been planned according to current medical practices, but problems sometimes occur. Call your health care provider if you have any problems or questions after your procedure. °WHAT TO EXPECT AFTER THE PROCEDURE °Recovery from surgery will be different for everyone. Some people feel well after 3 or 4 weeks, while for others it takes longer. After your procedure, it is typical to have the following: °· Nausea and a lack of appetite.   °· Constipation. °· Weakness and fatigue.   °· Depression or irritability.   °· Pain or discomfort at your incision site. °HOME CARE INSTRUCTIONS °· Take medicines only as directed by your health care provider. Do not stop taking medicines or start any new medicines without first checking with your health care provider. °· Take your pulse as directed by your health care provider. °· Perform deep breathing as directed by your health care provider. If you were given a device called an incentive spirometer, use it to practice deep breathing several times a day. Support your chest with a pillow or your arms when you take deep breaths or cough. °· Keep incision areas clean, dry, and protected. Remove or change any bandages (dressings) only as directed by your health care provider. You may have skin adhesive strips over the incision areas. Do not take the strips off. They will fall off on their own. °· Check incision areas daily for any swelling, redness, or drainage. °· If incisions were made in your legs, do the following: °¨ Avoid crossing your legs.   °¨ Avoid sitting for long periods of time. Change positions every 30 minutes.   °¨ Elevate your legs when you are sitting. °· Wear compression stockings as directed by your  health care provider. These stockings help keep blood clots from forming in your legs. °· Take showers once your health care provider approves. Until then, only take sponge baths. Pat incisions dry. Do not rub incisions with a washcloth or towel. Do not take baths, swim, or use a hot tub until your health care provider approves. °· Eat foods that are high in fiber, such as raw fruits and vegetables, whole grains, beans, and nuts. Meats should be lean cut. Avoid canned, processed, and fried foods. °· Drink enough fluid to keep your urine clear or pale yellow. °· Weigh yourself every day. This helps identify if you are retaining fluid that may make your heart and lungs work harder. °· Rest and limit activity as directed by your health care provider. You may be instructed to: °¨ Stop any activity at once if you have chest pain, shortness of breath, irregular heartbeats, or dizziness. Get help right away if you have any of these symptoms. °¨ Move around frequently for short periods or take short walks as directed by your health care provider. Increase your activities gradually. You may need physical therapy or cardiac rehabilitation to help strengthen your muscles and build your endurance. °¨ Avoid lifting, pushing, or pulling anything heavier than 10 lb (4.5 kg) for at least 6 weeks after surgery. °· Do not drive until your health care provider approves.  °· Ask your health care provider when you may return to work. °· Ask your health care provider when you may resume sexual activity. °· Keep all follow-up visits as directed by your health care   provider. This is important. °SEEK MEDICAL CARE IF: °· You have swelling, redness, increasing pain, or drainage at the site of an incision. °· You have a fever. °· You have swelling in your ankles or legs. °· You have pain in your legs.   °· You gain 2 or more pounds (0.9 kg) a day. °· You are nauseous or vomit. °· You have diarrhea.  °SEEK IMMEDIATE MEDICAL CARE IF: °· You have  chest pain that goes to your jaw or arms. °· You have shortness of breath.   °· You have a fast or irregular heartbeat.   °· You notice a "clicking" in your breastbone (sternum) when you move.   °· You have numbness or weakness in your arms or legs. °· You feel dizzy or light-headed.   °MAKE SURE YOU: °· Understand these instructions. °· Will watch your condition. °· Will get help right away if you are not doing well or get worse. °This information is not intended to replace advice given to you by your health care provider. Make sure you discuss any questions you have with your health care provider. °Document Released: 03/12/2005 Document Revised: 09/13/2014 Document Reviewed: 01/30/2013 °Elsevier Interactive Patient Education © 2017 [employer] ° ° °Endoscopic Saphenous Vein Harvesting, Care After °Introduction °Refer to this sheet in the next few weeks. These instructions provide you with information about caring for yourself after your procedure. Your health care provider may also give you more specific instructions. Your treatment has been planned according to current medical practices, but problems sometimes occur. Call your health care provider if you have any problems or questions after your procedure. °What can I expect after the procedure? °After the procedure, it is common to have: °· Pain. °· Bruising. °· Swelling. °· Numbness. °Follow these instructions at home: °Medicine °· Take over-the-counter and prescription medicines only as told by your health care provider. °· Do not drive or operate heavy machinery while taking prescription pain medicine. °Incision care °· Follow instructions from your health care provider about how to take care of the cut made during surgery (incision). Make sure you: °¨ Wash your hands with soap and water before you change your bandage (dressing). If soap and water are not available, use hand sanitizer. °¨ Change your dressing as told by your health care provider. °¨ Leave  stitches (sutures), skin glue, or adhesive strips in place. These skin closures may need to be in place for 2 weeks or longer. If adhesive strip edges start to loosen and curl up, you may trim the loose edges. Do not remove adhesive strips completely unless your health care provider tells you to do that. °· Check your incision area every day for signs of infection. Check for: °¨ More redness, swelling, or pain. °¨ More fluid or blood. °¨ Warmth. °¨ Pus or a bad smell. °General instructions °· Raise (elevate) your legs above the level of your heart while you are sitting or lying down. °· Do any exercises your health care providers have given you. These may include deep breathing, coughing, and walking exercises. °· Do not shower, take baths, swim, or use a hot tub unless told by your health care provider. °· Wear your elastic stocking if told by your health care provider. °· Keep all follow-up visits as told by your health care provider. This is important. °Contact a health care provider if: °· Medicine does not help your pain. °· Your pain gets worse. °· You have new leg bruises or your leg bruises get bigger. °· You have   a fever. °· Your leg feels numb. °· You have more redness, swelling, or pain around your incision. °· You have more fluid or blood coming from your incision. °· Your incision feels warm to the touch. °· You have pus or a bad smell coming from your incision. °Get help right away if: °· Your pain is severe. °· You develop pain, tenderness, warmth, redness, or swelling in any part of your leg. °· You have chest pain. °· You have trouble breathing. °This information is not intended to replace advice given to you by your health care provider. Make sure you discuss any questions you have with your health care provider. °Document Released: 05/05/2011 Document Revised: 01/29/2016 Document Reviewed: 07/07/2015 °© 2017 Elsevier ° ° °

## 2016-10-16 ENCOUNTER — Inpatient Hospital Stay (HOSPITAL_COMMUNITY): Payer: Medicare Other

## 2016-10-16 LAB — BASIC METABOLIC PANEL
Anion gap: 14 (ref 5–15)
BUN: 15 mg/dL (ref 6–20)
CO2: 26 mmol/L (ref 22–32)
Calcium: 9.3 mg/dL (ref 8.9–10.3)
Chloride: 97 mmol/L — ABNORMAL LOW (ref 101–111)
Creatinine, Ser: 1.05 mg/dL — ABNORMAL HIGH (ref 0.44–1.00)
GFR calc Af Amer: >60 mL/min (ref >60)
GFR calc non Af Amer: 52 mL/min — ABNORMAL LOW (ref >60)
Glucose, Bld: 142 mg/dL — ABNORMAL HIGH (ref 65–99)
Potassium: 4.8 mmol/L (ref 3.5–5.1)
Sodium: 137 mmol/L (ref 135–145)

## 2016-10-16 LAB — CBC
HCT: 30.5 % — ABNORMAL LOW (ref 36.0–46.0)
Hemoglobin: 9.2 g/dL — ABNORMAL LOW (ref 12.0–15.0)
MCH: 26.7 pg (ref 26.0–34.0)
MCHC: 30.2 g/dL (ref 30.0–36.0)
MCV: 88.7 fL (ref 78.0–100.0)
Platelets: 166 10*3/uL (ref 150–400)
RBC: 3.44 MIL/uL — ABNORMAL LOW (ref 3.87–5.11)
RDW: 25.9 % — ABNORMAL HIGH (ref 11.5–15.5)
WBC: 6 10*3/uL (ref 4.0–10.5)

## 2016-10-16 LAB — GLUCOSE, CAPILLARY
Glucose-Capillary: 139 mg/dL — ABNORMAL HIGH (ref 65–99)
Glucose-Capillary: 184 mg/dL — ABNORMAL HIGH (ref 65–99)
Glucose-Capillary: 186 mg/dL — ABNORMAL HIGH (ref 65–99)
Glucose-Capillary: 164 mg/dL — ABNORMAL HIGH (ref 65–99)

## 2016-10-16 MED ORDER — LACTULOSE 10 GM/15ML PO SOLN
20.0000 g | Freq: Every day | ORAL | Status: DC | PRN
  Administered 2016-10-16: 20 g via ORAL
  Filled 2016-10-16: qty 30

## 2016-10-16 MED ORDER — METFORMIN HCL 500 MG PO TABS
1000.0000 mg | ORAL_TABLET | Freq: Two times a day (BID) | ORAL | Status: DC
  Administered 2016-10-16 – 2016-10-18 (×5): 1000 mg via ORAL
  Filled 2016-10-16 (×6): qty 2

## 2016-10-16 NOTE — Progress Notes (Signed)
CARDIAC REHAB PHASE I   PRE:  Rate/Rhythm: 80 sinus rhythm  BP:  Supine:   Sitting: 125/48  Standing:    SaO2: 90% RA   MODE:  Ambulation: 380 ft   POST:  Rate/Rhythem: 101  BP:  Supine:   Sitting: 138/58  Standing:    SaO2: 98% RA  Pt ambulated in hallway x1 assist using rolling walker,  slow steady gait.  Tolerated well, asymptomatic.  Pt returned to chair, call light in reach, husband at bedside.  Pt frequently reminded to practice sternal precautions and use heart pillow.  Pt also encouraged to continue good pulmonary hygiene, proper IS use and Pursed lip breathing.  Understanding verbalized. Teach back method used, pt able to return demonstrate.    [employer]

## 2016-10-16 NOTE — Progress Notes (Signed)
Patient Name: [name]      SUBJECTIVE: Feeling better.  Past Medical History:  Diagnosis Date  . Arthritis   . Cancer [facility])    colon cancer  . Coronary artery disease   . Diabetes mellitus without complication ([place])   . GERD (gastroesophageal reflux disease)   . Heart murmur   . History of blood transfusion   . History of kidney stones   . Hyperlipidemia   . Hypertension   . Iron deficiency anemia due to chronic blood loss 09/17/2016  . 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 PONV (postoperative nausea and vomiting)    gets nauseated easily    Scheduled Meds:  Scheduled Meds: . acetaminophen  1,000 mg Oral Q6H   Or  . acetaminophen (TYLENOL) oral liquid 160 mg/5 mL  1,000 mg Per Tube Q6H  . amiodarone  400 mg Oral BID  . aspirin EC  81 mg Oral Daily  . atorvastatin  20 mg Oral Daily  . bisacodyl  10 mg Oral Daily   Or  . bisacodyl  10 mg Rectal Daily  . Chlorhexidine Gluconate Cloth  6 each Topical Daily  . diltiazem  60 mg Oral Q12H  . docusate sodium  200 mg Oral Daily  . enoxaparin (LOVENOX) injection  30 mg Subcutaneous QHS  . furosemide  40 mg Oral Daily  . insulin aspart  0-24 Units Subcutaneous TID AC & HS  . insulin detemir  15 Units Subcutaneous Daily  . mouth rinse  15 mL Mouth Rinse BID  . mupirocin ointment  1 application Nasal BID  . pantoprazole  40 mg Oral Daily  . potassium chloride  20 mEq Oral Daily  . sodium chloride flush  3 mL Intravenous Q12H   Continuous Infusions: . sodium chloride Stopped (10/12/16 1429)  . sodium chloride    . sodium chloride 10 mL/hr at 10/12/16 1800  . lactated ringers 20 mL/hr at 10/15/16 0700  . lactated ringers Stopped (10/11/16 1315)   sodium chloride, diphenhydrAMINE, hydrALAZINE, lactated ringers, metoprolol, morphine injection, ondansetron (ZOFRAN) IV, oxyCODONE, traMADol    PHYSICAL EXAM Vitals:   10/15/16 1335 10/15/16 2015 10/16/16 0333 10/16/16 0545  BP: (!) 121/52 (!) 127/55  (!) 143/50    Pulse: 72 71  73  Resp: 18 18  18   Temp: 98.1 F (36.7 C) 99 F (37.2 C)  98.6 F (37 C)  TempSrc: Oral Oral  Oral  SpO2: 99% 100%  100%  Weight:   152 lb 11.2 oz (69.3 kg)   Height:        Well developed and nourished in no acute distress HENT normal Neck supple with JVP-flat Clear Regular rate and rhythm, no murmurs or gallops Abd-soft with active BS No Clubbing cyanosis edema Skin-warm and dry A & Oriented  Grossly normal sensory and motor function   TELEMETRY: Reviewed personnally pt in AV pacing with pseudofusion     Intake/Output Summary (Last 24 hours) at 10/16/16 0744 Last data filed at 10/16/16 0551  Gross per 24 hour  Intake            913.4 ml  Output             1000 ml  Net            -86.6 ml    LABS: Basic Metabolic Panel:  Recent Labs Lab 10/11/16 1858  10/12/16 0333 10/12/16 1757 10/12/16 1814 10/13/16 0345 10/14/16 0415 10/15/16 0355 10/16/16 0548  NA  141  --  140  --  138 138 136 137 137  K 3.8  --  4.0  --  4.1 4.3 4.3 4.0 4.8  CL 106  --  107  --  100* 101 99* 100* 97*  CO2  --   --  25  --   --  28 27 29 26   GLUCOSE 132*  --  85  --  124* 114* 157* 130* 142*  BUN 14  --  12  --  11 9 13 16 15   CREATININE 0.80  < > 0.90 1.01* 1.00 0.97 0.91 0.96 1.05*  CALCIUM  --   < > 8.4*  --   --  8.9 8.7* 8.6* 9.3  MG  --   < > 2.3 2.0  --   --   --   --   --   < > = values in this interval not displayed. Cardiac Enzymes: No results for input(s): CKTOTAL, CKMB, CKMBINDEX, TROPONINI in the last 72 hours. CBC:  Recent Labs Lab 10/11/16 1611  10/11/16 1915 10/12/16 0333 10/12/16 1757 10/12/16 1814 10/13/16 0345 10/14/16 0415 10/16/16 0548  WBC 8.6  --  7.0 7.6 7.0  --  6.9 7.0 6.0  HGB 9.8*  < > 9.5* 9.0* 9.8* 9.9* 9.5* 8.8* 9.2*  HCT 30.5*  < > 29.8* 28.3* 30.6* 29.0* 30.2* 28.5* 30.5*  MCV 83.6  --  83.2 84.2 86.0  --  86.8 87.4 88.7  PLT 135*  --  129* 123* 115*  --  110* 124* 166  < > = values in this interval not  displayed. PROTIME: No results for input(s): LABPROT, INR in the last 72 hours. Liver Function Tests: No results for input(s): AST, ALT, ALKPHOS, BILITOT, PROT, ALBUMIN in the last 72 hours. No results for input(s): LIPASE, AMYLASE in the last 72 hours. BNP: BNP (last 3 results) No results for input(s): BNP in the last 8760 hours.  ProBNP (last 3 results) No results for input(s): PROBNP in the last 8760 hours.  D-Dimer: No results for input(s): DDIMER in the last 72 hours. Hemoglobin A1C: No results for input(s): HGBA1C in the last 72 hours. Fasting Lipid Panel: No results for input(s): CHOL, HDL, LDLCALC, TRIG, CHOLHDL, LDLDIRECT in the last 72 hours. Thyroid Function Tests: No results for input(s): TSH, T4TOTAL, T3FREE, THYROIDAB in the last 72 hours.  Invalid input(s): FREET3 Anemia Panel: No results for input(s): VITAMINB12, FOLATE, FERRITIN, TIBC, IRON, RETICCTPCT in the last 72 hours.     ASSESSMENT AND PLAN:  Active Problems: CAD s/p CABG acutely  Atrial Fib with RVR   Post termination pauses  Adenocarcinoma of cecum with pending hemicolectomy   The patient is not using her pacemaker present. Surgery decided just further down or turning it off. Continue amiodarone. Which she seems to be tolerating.   There are some PACs.   Signed, Virl Axe MD  10/16/2016

## 2016-10-16 NOTE — Progress Notes (Addendum)
      [address]       [place] [address]             [phone]      5 Days Post-Op Procedure(s) (LRB): CORONARY ARTERY BYPASS GRAFTING (CABG) times three using left internal mammary artery and right leg saphenous vein (N/A) TRANSESOPHAGEAL ECHOCARDIOGRAM (TEE) (N/A)   Subjective:  No specific complaints.  States doing okay.  Ambulating without difficulty.  No BM  Objective: Vital signs in last 24 hours: Temp:  [97.6 F (36.4 C)-99 F (37.2 C)] 98.6 F (37 C) (02/10 0545) Pulse Rate:  [71-73] 73 (02/10 0545) Cardiac Rhythm: A-V Sequential paced (02/10 0749) Resp:  [18-19] 18 (02/10 0545) BP: (121-143)/(50-55) 143/50 (02/10 0545) SpO2:  [99 %-100 %] 100 % (02/10 0545) Weight:  [152 lb 11.2 oz (69.3 kg)] 152 lb 11.2 oz (69.3 kg) (02/10 0333)  Intake/Output from previous day: 02/09 0701 - 02/10 0700 In: 913.4 [P.O.:840; I.V.:73.4] Out: 1000 [Urine:1000]  General appearance: alert, cooperative and no distress Heart: regular rate and rhythm Lungs: clear to auscultation bilaterally Abdomen: soft, non-tender; bowel sounds normal; no masses,  no organomegaly Extremities: edema trace Wound: clean and dry  Lab Results:  Recent Labs  10/14/16 0415 10/16/16 0548  WBC 7.0 6.0  HGB 8.8* 9.2*  HCT 28.5* 30.5*  PLT 124* 166   BMET:  Recent Labs  10/15/16 0355 10/16/16 0548  NA 137 137  K 4.0 4.8  CL 100* 97*  CO2 29 26  GLUCOSE 130* 142*  BUN 16 15  CREATININE 0.96 1.05*  CALCIUM 8.6* 9.3    PT/INR: No results for input(s): LABPROT, INR in the last 72 hours. ABG    Component Value Date/Time   PHART 7.369 10/11/2016 1902   HCO3 24.1 10/11/2016 1902   TCO2 27 10/12/2016 1814   ACIDBASEDEF 1.0 10/11/2016 1902   O2SAT 99.0 10/11/2016 1902   CBG (last 3)   Recent Labs  10/15/16 1627 10/15/16 2115 10/16/16 0611  GLUCAP 165* 141* 139*    Assessment/Plan: S/P Procedure(s) (LRB): CORONARY ARTERY BYPASS GRAFTING (CABG) times three  using left internal mammary artery and right leg saphenous vein (N/A) TRANSESOPHAGEAL ECHOCARDIOGRAM (TEE) (N/A)  1. CV- PAF, currently NSR- continue  Amiodarone and Cardizem 2. Pulm- no acute issues, continue IS 3. Renal- creatinine at 1.05, weight is stable, no significant LE edema continue Lasix 4. DM- sugars controlled, d/c insulin restart home Metformin 5. LOC constipation- dulcolax suppository, lactulose prn 6. Dispo- patient stable, will tape pacing wires to skin today, monitor HR if no further arrythmia will d/c EPW in AM, continue diuretics, continue current care   LOS: 5 days    BARRETT, ERIN 10/16/2016 Patient seen and examined, agree with above Was paced earlier this AM- unclear how slow she would have been without pacer- dc external pacer and observe  Remo Lipps C. Roxan Hockey, MD [facility] [phone]

## 2016-10-17 LAB — GLUCOSE, CAPILLARY
Glucose-Capillary: 161 mg/dL — ABNORMAL HIGH (ref 65–99)
Glucose-Capillary: 162 mg/dL — ABNORMAL HIGH (ref 65–99)
Glucose-Capillary: 163 mg/dL — ABNORMAL HIGH (ref 65–99)
Glucose-Capillary: 141 mg/dL — ABNORMAL HIGH (ref 65–99)

## 2016-10-17 NOTE — Progress Notes (Signed)
Patient converted to rapid afib RVR with heart rate from 130's to 170 at 0118 per CCMD. Patient's vitals stable with exception to HR. EKG done showing rapid afib RVR. Patient given 5mg  of metoprolol. Patient converted back to sinus rhythm at 0147. Patient continues to be in NSR, heart rate in 60's. Will continue to monitor.

## 2016-10-17 NOTE — Progress Notes (Signed)
Patient Name: [name]      SUBJECTIVE: Feeling better  Pacer turned off yesterday  Afib last pm with RVR   Past Medical History:  Diagnosis Date  . Arthritis   . Cancer [facility])    colon cancer  . Coronary artery disease   . Diabetes mellitus without complication ([place])   . GERD (gastroesophageal reflux disease)   . Heart murmur   . History of blood transfusion   . History of kidney stones   . Hyperlipidemia   . Hypertension   . Iron deficiency anemia due to chronic blood loss 09/17/2016  . 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 PONV (postoperative nausea and vomiting)    gets nauseated easily    Scheduled Meds:  Scheduled Meds: . amiodarone  400 mg Oral BID  . aspirin EC  81 mg Oral Daily  . atorvastatin  20 mg Oral Daily  . bisacodyl  10 mg Oral Daily   Or  . bisacodyl  10 mg Rectal Daily  . Chlorhexidine Gluconate Cloth  6 each Topical Daily  . diltiazem  60 mg Oral Q12H  . docusate sodium  200 mg Oral Daily  . enoxaparin (LOVENOX) injection  30 mg Subcutaneous QHS  . furosemide  40 mg Oral Daily  . insulin aspart  0-24 Units Subcutaneous TID AC & HS  . mouth rinse  15 mL Mouth Rinse BID  . metFORMIN  1,000 mg Oral BID WC  . pantoprazole  40 mg Oral Daily  . potassium chloride  20 mEq Oral Daily  . sodium chloride flush  3 mL Intravenous Q12H   Continuous Infusions: . sodium chloride Stopped (10/12/16 1429)  . sodium chloride    . sodium chloride 20 mL/hr at 10/17/16 0133  . lactated ringers 20 mL/hr at 10/15/16 0700  . lactated ringers Stopped (10/11/16 1315)   sodium chloride, diphenhydrAMINE, hydrALAZINE, lactated ringers, lactulose, metoprolol, ondansetron (ZOFRAN) IV, oxyCODONE, traMADol    PHYSICAL EXAM Vitals:   10/16/16 2100 10/17/16 0123 10/17/16 0529 10/17/16 0855  BP: (!) 133/53 118/60 (!) 128/48 (!) 113/52  Pulse: 70 (!) 142 74   Resp: 18 20 18    Temp: 98.6 F (37 C) 98.6 F (37 C) 99.2 F (37.3 C)   TempSrc: Oral Oral Oral     SpO2: 97% 95% 95%   Weight:   147 lb 14.4 oz (67.1 kg)   Height:        Well developed and nourished in no acute distress HENT normal Neck supple with JVP-flat Clear Regular rate and rhythm, no murmurs or gallops Abd-soft with active BS No Clubbing cyanosis edema Skin-warm and dry A & Oriented  Grossly normal sensory and motor function   TELEMETRY: Reviewed personnally two episodes of afib RVR with post termination pauses of abut 3 sec   Intake/Output Summary (Last 24 hours) at 10/17/16 0916 Last data filed at 10/17/16 0850  Gross per 24 hour  Intake              240 ml  Output                0 ml  Net              240 ml    LABS: Basic Metabolic Panel:  Recent Labs Lab 10/11/16 1858  10/12/16 0333 10/12/16 1757 10/12/16 1814 10/13/16 0345 10/14/16 0415 10/15/16 0355 10/16/16 0548  NA 141  --  140  --  138 138 136  137 137  K 3.8  --  4.0  --  4.1 4.3 4.3 4.0 4.8  CL 106  --  107  --  100* 101 99* 100* 97*  CO2  --   --  25  --   --  28 27 29 26   GLUCOSE 132*  --  85  --  124* 114* 157* 130* 142*  BUN 14  --  12  --  11 9 13 16 15   CREATININE 0.80  < > 0.90 1.01* 1.00 0.97 0.91 0.96 1.05*  CALCIUM  --   < > 8.4*  --   --  8.9 8.7* 8.6* 9.3  MG  --   < > 2.3 2.0  --   --   --   --   --   < > = values in this interval not displayed. Cardiac Enzymes: No results for input(s): CKTOTAL, CKMB, CKMBINDEX, TROPONINI in the last 72 hours. CBC:  Recent Labs Lab 10/11/16 1611  10/11/16 1915 10/12/16 0333 10/12/16 1757 10/12/16 1814 10/13/16 0345 10/14/16 0415 10/16/16 0548  WBC 8.6  --  7.0 7.6 7.0  --  6.9 7.0 6.0  HGB 9.8*  < > 9.5* 9.0* 9.8* 9.9* 9.5* 8.8* 9.2*  HCT 30.5*  < > 29.8* 28.3* 30.6* 29.0* 30.2* 28.5* 30.5*  MCV 83.6  --  83.2 84.2 86.0  --  86.8 87.4 88.7  PLT 135*  --  129* 123* 115*  --  110* 124* 166  < > = values in this interval not displayed. PROTIME: No results for input(s): LABPROT, INR in the last 72 hours. Liver Function Tests: No  results for input(s): AST, ALT, ALKPHOS, BILITOT, PROT, ALBUMIN in the last 72 hours. No results for input(s): LIPASE, AMYLASE in the last 72 hours. BNP: BNP (last 3 results) No results for input(s): BNP in the last 8760 hours.  ProBNP (last 3 results) No results for input(s): PROBNP in the last 8760 hours.  D-Dimer: No results for input(s): DDIMER in the last 72 hours. Hemoglobin A1C: No results for input(s): HGBA1C in the last 72 hours. Fasting Lipid Panel: No results for input(s): CHOL, HDL, LDLCALC, TRIG, CHOLHDL, LDLDIRECT in the last 72 hours. Thyroid Function Tests: No results for input(s): TSH, T4TOTAL, T3FREE, THYROIDAB in the last 72 hours.  Invalid input(s): FREET3 Anemia Panel: No results for input(s): VITAMINB12, FOLATE, FERRITIN, TIBC, IRON, RETICCTPCT in the last 72 hours.     ASSESSMENT AND PLAN:  Active Problems: CAD s/p CABG acutely  Atrial Fib with RVR   Post termination pauses  Adenocarcinoma of cecum with pending hemicolectomy   Pacemaker off   Afib RVR  yesterday  Two episodes-- post termination pauses about 2.5-3 sec Continue amiodarone Would ask TCTS to [place]     Signed, Virl Axe MD  10/17/2016

## 2016-10-17 NOTE — Progress Notes (Addendum)
      [address]       [place] [address]             [phone]      6 Days Post-Op Procedure(s) (LRB): CORONARY ARTERY BYPASS GRAFTING (CABG) times three using left internal mammary artery and right leg saphenous vein (N/A) TRANSESOPHAGEAL ECHOCARDIOGRAM (TEE) (N/A)   Subjective:  No new complaints.  States her heart sped up again last night.  + BM  Objective: Vital signs in last 24 hours: Temp:  [98.6 F (37 C)-99.2 F (37.3 C)] 99.2 F (37.3 C) (02/11 0529) Pulse Rate:  [70-142] 74 (02/11 0529) Cardiac Rhythm: Normal sinus rhythm (02/11 0726) Resp:  [18-20] 18 (02/11 0529) BP: (113-133)/(48-60) 113/52 (02/11 0855) SpO2:  [95 %-97 %] 95 % (02/11 0529) Weight:  [147 lb 14.4 oz (67.1 kg)] 147 lb 14.4 oz (67.1 kg) (02/11 0529)  Intake/Output from previous day: No intake/output data recorded. Intake/Output this shift: Total I/O In: 240 [P.O.:240] Out: -   General appearance: alert, cooperative and no distress Heart: regular rate and rhythm Lungs: clear to auscultation bilaterally Abdomen: soft, non-tender; bowel sounds normal; no masses,  no organomegaly Extremities: edema trace Wound: clean and dry  Lab Results:  Recent Labs  10/16/16 0548  WBC 6.0  HGB 9.2*  HCT 30.5*  PLT 166   BMET:  Recent Labs  10/15/16 0355 10/16/16 0548  NA 137 137  K 4.0 4.8  CL 100* 97*  CO2 29 26  GLUCOSE 130* 142*  BUN 16 15  CREATININE 0.96 1.05*  CALCIUM 8.6* 9.3    PT/INR: No results for input(s): LABPROT, INR in the last 72 hours. ABG    Component Value Date/Time   PHART 7.369 10/11/2016 1902   HCO3 24.1 10/11/2016 1902   TCO2 27 10/12/2016 1814   ACIDBASEDEF 1.0 10/11/2016 1902   O2SAT 99.0 10/11/2016 1902   CBG (last 3)   Recent Labs  10/16/16 1638 10/16/16 2122 10/17/16 0643  GLUCAP 186* 164* 161*    Assessment/Plan: S/P Procedure(s) (LRB): CORONARY ARTERY BYPASS GRAFTING (CABG) times three using left internal mammary artery  and right leg saphenous vein (N/A) TRANSESOPHAGEAL ECHOCARDIOGRAM (TEE) (N/A)  1. CV- PAF, 2.5-3 sec pause overnight... Currently NSR- on Amiodarone, Cardizem 2. Pulm- wean oxygen as tolerated, continue IS 3. Renal- creatinine has been stable, weight is almost at baseline, continue Lasix for now 4. DM- sugars controlled 5. 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 No anticoagulation due to GI bleeding issues related to cecal mass  Remo Lipps C. Roxan Hockey, MD [facility] [phone]

## 2016-10-18 LAB — GLUCOSE, CAPILLARY
Glucose-Capillary: 138 mg/dL — ABNORMAL HIGH (ref 65–99)
Glucose-Capillary: 159 mg/dL — ABNORMAL HIGH (ref 65–99)
Glucose-Capillary: 167 mg/dL — ABNORMAL HIGH (ref 65–99)
Glucose-Capillary: 147 mg/dL — ABNORMAL HIGH (ref 65–99)

## 2016-10-18 MED ORDER — AMIODARONE HCL 200 MG PO TABS
200.0000 mg | ORAL_TABLET | Freq: Two times a day (BID) | ORAL | Status: DC
  Administered 2016-10-18 – 2016-10-19 (×3): 200 mg via ORAL
  Filled 2016-10-18 (×3): qty 1

## 2016-10-18 MED ORDER — ACETAMINOPHEN 325 MG PO TABS
650.0000 mg | ORAL_TABLET | Freq: Four times a day (QID) | ORAL | Status: DC | PRN
  Administered 2016-10-18 – 2016-10-19 (×3): 650 mg via ORAL
  Filled 2016-10-18 (×3): qty 2

## 2016-10-18 NOTE — Progress Notes (Addendum)
[address]       [place] [address]             [phone]      7 Days Post-Op Procedure(s) (LRB): CORONARY ARTERY BYPASS GRAFTING (CABG) times three using left internal mammary artery and right leg saphenous vein (N/A) TRANSESOPHAGEAL ECHOCARDIOGRAM (TEE) (N/A) Subjective:  c/o some nausea, poor appetite. Says she hasn't been walking since she went into afib  Objective: Vital signs in last 24 hours: Temp:  [98.3 F (36.8 C)-98.9 F (37.2 C)] 98.3 F (36.8 C) (02/12 0559) Pulse Rate:  [73-80] 80 (02/12 0559) Cardiac Rhythm: Normal sinus rhythm (02/11 1900) Resp:  [16-20] 18 (02/12 0559) BP: (110-146)/(52-67) 140/67 (02/12 0559) SpO2:  [92 %-96 %] 96 % (02/12 0559) Weight:  [145 lb 1.6 oz (65.8 kg)] 145 lb 1.6 oz (65.8 kg) (02/12 0216)  Hemodynamic parameters for last 24 hours:    Intake/Output from previous day: 02/11 0701 - 02/12 0700 In: 240 [P.O.:240] Out: -  Intake/Output this shift: No intake/output data recorded.  General appearance: alert, cooperative and no distress Heart: regular rate and rhythm and soft rub Lungs: dim in lower fields Abdomen: soft, nontender Extremities: no edema Wound: incis healing well  Lab Results:  Recent Labs  10/16/16 0548  WBC 6.0  HGB 9.2*  HCT 30.5*  PLT 166   BMET:  Recent Labs  10/16/16 0548  NA 137  K 4.8  CL 97*  CO2 26  GLUCOSE 142*  BUN 15  CREATININE 1.05*  CALCIUM 9.3    PT/INR: No results for input(s): LABPROT, INR in the last 72 hours. ABG    Component Value Date/Time   PHART 7.369 10/11/2016 1902   HCO3 24.1 10/11/2016 1902   TCO2 27 10/12/2016 1814   ACIDBASEDEF 1.0 10/11/2016 1902   O2SAT 99.0 10/11/2016 1902   CBG (last 3)   Recent Labs  10/17/16 1642 10/17/16 2203 10/18/16 0609  GLUCAP 163* 141* 138*    Meds Scheduled Meds: . amiodarone  400 mg Oral BID  . aspirin EC  81 mg Oral Daily  . atorvastatin  20 mg Oral Daily  . bisacodyl  10 mg Oral Daily     Or  . bisacodyl  10 mg Rectal Daily  . Chlorhexidine Gluconate Cloth  6 each Topical Daily  . diltiazem  60 mg Oral Q12H  . docusate sodium  200 mg Oral Daily  . enoxaparin (LOVENOX) injection  30 mg Subcutaneous QHS  . furosemide  40 mg Oral Daily  . insulin aspart  0-24 Units Subcutaneous TID AC & HS  . mouth rinse  15 mL Mouth Rinse BID  . metFORMIN  1,000 mg Oral BID WC  . pantoprazole  40 mg Oral Daily  . potassium chloride  20 mEq Oral Daily  . sodium chloride flush  3 mL Intravenous Q12H   Continuous Infusions: . sodium chloride Stopped (10/12/16 1429)  . sodium chloride    . sodium chloride 20 mL/hr at 10/17/16 0133  . lactated ringers 20 mL/hr at 10/15/16 0700  . lactated ringers Stopped (10/11/16 1315)   PRN Meds:.sodium chloride, diphenhydrAMINE, hydrALAZINE, lactated ringers, lactulose, metoprolol, ondansetron (ZOFRAN) IV, oxyCODONE, traMADol  Xrays No results found.  Assessment/Plan: S/P Procedure(s) (LRB): CORONARY ARTERY BYPASS GRAFTING (CABG) times three using left internal mammary artery and right leg saphenous vein (N/A) TRANSESOPHAGEAL ECHOCARDIOGRAM (TEE) (N/A)  1 doing fair 2 need to eat something  and be more mobile -  3 sinus rhythm, no further pausesd, QTc 505- decrease amio especially with nausea 4 d.c epws 5 sugars fair control- cont current rx 6 has a soft rub- no nsaids with nausea 7 stop lasix at preop weight and poor intake, no edema   LOS: 7 days    [name],[name] 10/18/2016 Cont bid amio and low dose cardizem- and monitor  home wed patient examined and medical record reviewed,agree with above note. Tharon Aquas Trigt III 10/18/2016

## 2016-10-18 NOTE — Progress Notes (Signed)
Patient Name: [name]      SUBJECTIVE: Feeling better  No AF seen in last 24 hours on monitor.  Past Medical History:  Diagnosis Date  . Arthritis   . Cancer [facility])    colon cancer  . Coronary artery disease   . Diabetes mellitus without complication ([place])   . GERD (gastroesophageal reflux disease)   . Heart murmur   . History of blood transfusion   . History of kidney stones   . Hyperlipidemia   . Hypertension   . Iron deficiency anemia due to chronic blood loss 09/17/2016  . 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 PONV (postoperative nausea and vomiting)    gets nauseated easily    Scheduled Meds:  Scheduled Meds: . amiodarone  400 mg Oral BID  . aspirin EC  81 mg Oral Daily  . atorvastatin  20 mg Oral Daily  . bisacodyl  10 mg Oral Daily   Or  . bisacodyl  10 mg Rectal Daily  . Chlorhexidine Gluconate Cloth  6 each Topical Daily  . diltiazem  60 mg Oral Q12H  . docusate sodium  200 mg Oral Daily  . enoxaparin (LOVENOX) injection  30 mg Subcutaneous QHS  . furosemide  40 mg Oral Daily  . insulin aspart  0-24 Units Subcutaneous TID AC & HS  . mouth rinse  15 mL Mouth Rinse BID  . metFORMIN  1,000 mg Oral BID WC  . pantoprazole  40 mg Oral Daily  . potassium chloride  20 mEq Oral Daily  . sodium chloride flush  3 mL Intravenous Q12H   Continuous Infusions: . sodium chloride Stopped (10/12/16 1429)  . sodium chloride    . sodium chloride 20 mL/hr at 10/17/16 0133  . lactated ringers 20 mL/hr at 10/15/16 0700  . lactated ringers Stopped (10/11/16 1315)   sodium chloride, diphenhydrAMINE, hydrALAZINE, lactated ringers, lactulose, metoprolol, ondansetron (ZOFRAN) IV, oxyCODONE, traMADol    PHYSICAL EXAM Vitals:   10/17/16 1328 10/17/16 2200 10/18/16 0216 10/18/16 0559  BP: (!) 110/54 (!) 146/62  140/67  Pulse: 73 77  80  Resp: 16 20  18   Temp: 98.6 F (37 C) 98.9 F (37.2 C)  98.3 F (36.8 C)  TempSrc: Oral Oral  Oral  SpO2: 96% 92%  96%    Weight:   145 lb 1.6 oz (65.8 kg)   Height:        Well developed and nourished in no acute distress HENT normal Neck supple with JVP-flat Clear Regular rate and rhythm, no murmurs or gallops Abd-soft with active BS No Clubbing cyanosis edema Skin-warm and dry A & Oriented  Grossly normal sensory and motor function   TELEMETRY: Reviewed personnally two episodes of afib RVR with post termination pauses of abut 3 sec   Intake/Output Summary (Last 24 hours) at 10/18/16 0750 Last data filed at 10/17/16 0850  Gross per 24 hour  Intake              240 ml  Output                0 ml  Net              240 ml    LABS: Basic Metabolic Panel:  Recent Labs Lab 10/11/16 1858  10/12/16 0333 10/12/16 1757 10/12/16 1814 10/13/16 0345 10/14/16 0415 10/15/16 0355 10/16/16 0548  NA 141  --  140  --  138 138 136 137 137  K  3.8  --  4.0  --  4.1 4.3 4.3 4.0 4.8  CL 106  --  107  --  100* 101 99* 100* 97*  CO2  --   --  25  --   --  28 27 29 26   GLUCOSE 132*  --  85  --  124* 114* 157* 130* 142*  BUN 14  --  12  --  11 9 13 16 15   CREATININE 0.80  < > 0.90 1.01* 1.00 0.97 0.91 0.96 1.05*  CALCIUM  --   < > 8.4*  --   --  8.9 8.7* 8.6* 9.3  MG  --   < > 2.3 2.0  --   --   --   --   --   < > = values in this interval not displayed. Cardiac Enzymes: No results for input(s): CKTOTAL, CKMB, CKMBINDEX, TROPONINI in the last 72 hours. CBC:  Recent Labs Lab 10/11/16 1611  10/11/16 1915 10/12/16 0333 10/12/16 1757 10/12/16 1814 10/13/16 0345 10/14/16 0415 10/16/16 0548  WBC 8.6  --  7.0 7.6 7.0  --  6.9 7.0 6.0  HGB 9.8*  < > 9.5* 9.0* 9.8* 9.9* 9.5* 8.8* 9.2*  HCT 30.5*  < > 29.8* 28.3* 30.6* 29.0* 30.2* 28.5* 30.5*  MCV 83.6  --  83.2 84.2 86.0  --  86.8 87.4 88.7  PLT 135*  --  129* 123* 115*  --  110* 124* 166  < > = values in this interval not displayed. PROTIME: No results for input(s): LABPROT, INR in the last 72 hours. Liver Function Tests: No results for input(s):  AST, ALT, ALKPHOS, BILITOT, PROT, ALBUMIN in the last 72 hours. No results for input(s): LIPASE, AMYLASE in the last 72 hours. BNP: BNP (last 3 results) No results for input(s): BNP in the last 8760 hours.  ProBNP (last 3 results) No results for input(s): PROBNP in the last 8760 hours.  D-Dimer: No results for input(s): DDIMER in the last 72 hours. Hemoglobin A1C: No results for input(s): HGBA1C in the last 72 hours. Fasting Lipid Panel: No results for input(s): CHOL, HDL, LDLCALC, TRIG, CHOLHDL, LDLDIRECT in the last 72 hours. Thyroid Function Tests: No results for input(s): TSH, T4TOTAL, T3FREE, THYROIDAB in the last 72 hours.  Invalid input(s): FREET3 Anemia Panel: No results for input(s): VITAMINB12, FOLATE, FERRITIN, TIBC, IRON, RETICCTPCT in the last 72 hours.     ASSESSMENT AND PLAN:  Active Problems: CAD s/p CABG acutely  Atrial Fib with RVR   Post termination pauses  Adenocarcinoma of cecum with pending hemicolectomy   Pacemaker off   No AF in the last 24 hours. Had post termination pauses of 2-3 seconds ovwer the weekend. Continue amiodarone Would have her on anticoagulation at hospital discharge for stroke prevention. CHADS2VASc 3.    Signed, Britanni Yarde Meredith Leeds MD  10/18/2016

## 2016-10-18 NOTE — Progress Notes (Signed)
EPW removed per protocol at 1100. Pt tolerated well. VSS - no telemetry changes notice. 1 hr bedrest completed at 1200.  Fritz Pickerel, RN

## 2016-10-18 NOTE — Progress Notes (Signed)
CARDIAC REHAB PHASE I   PRE:  Rate/Rhythm: 74 SR  BP:  Supine:   Sitting: 141/62  Standing:    SaO2: 97-98%RA  MODE:  Ambulation: 300 ft   POST:  Rate/Rhythm: 85 SR  BP:  Supine:   Sitting: 130/60  Standing:    SaO2: 95%RA 0950-1015 Pt wanting to walk but to stay close to bathroom as she is having frequent small BMs. We walked 300 ft on RA with rolling walker with steady gait. Observed pt walking to bathroom independently. To recliner after walk. Only walked 300 ft as she needed to return to bathroom.   Graylon Good, RN BSN  10/18/2016 10:12 AM

## 2016-10-18 NOTE — Care Management Important Message (Signed)
Important Message  Patient Details  Name: [name] MRN: [id number] Date of Birth: [birth year]   Medicare Important Message Given:  Yes    Shelia Kingsberry Montine Circle 10/18/2016, 4:14 PM

## 2016-10-19 LAB — BASIC METABOLIC PANEL
Anion gap: 13 (ref 5–15)
BUN: 23 mg/dL — AB (ref 6–20)
CHLORIDE: 96 mmol/L — AB (ref 101–111)
CO2: 28 mmol/L (ref 22–32)
Calcium: 9 mg/dL (ref 8.9–10.3)
Creatinine, Ser: 1.31 mg/dL — ABNORMAL HIGH (ref 0.44–1.00)
GFR calc non Af Amer: 40 mL/min — ABNORMAL LOW (ref >60)
GFR, EST AFRICAN AMERICAN: 46 mL/min — AB (ref >60)
Glucose, Bld: 133 mg/dL — ABNORMAL HIGH (ref 65–99)
Potassium: 3.8 mmol/L (ref 3.5–5.1)
SODIUM: 137 mmol/L (ref 135–145)

## 2016-10-19 LAB — GLUCOSE, CAPILLARY
Glucose-Capillary: 136 mg/dL — ABNORMAL HIGH (ref 65–99)
Glucose-Capillary: 142 mg/dL — ABNORMAL HIGH (ref 65–99)

## 2016-10-19 MED ORDER — TRAMADOL HCL 50 MG PO TABS: 50.0000 mg | ORAL_TABLET | ORAL | 0 refills | Status: DC | PRN

## 2016-10-19 MED ORDER — AMIODARONE HCL 200 MG PO TABS: 200.0000 mg | ORAL_TABLET | Freq: Two times a day (BID) | ORAL | 1 refills | Status: DC

## 2016-10-19 MED ORDER — VALSARTAN 40 MG PO TABS: 40.0000 mg | ORAL_TABLET | Freq: Every day | ORAL | 3 refills | Status: DC

## 2016-10-19 MED ORDER — ASPIRIN 81 MG PO TBEC: 81.0000 mg | DELAYED_RELEASE_TABLET | Freq: Every day | ORAL | Status: AC

## 2016-10-19 MED ORDER — DILTIAZEM HCL ER 60 MG PO CP12: 60.0000 mg | ORAL_CAPSULE | Freq: Two times a day (BID) | ORAL | 3 refills | Status: DC

## 2016-10-19 NOTE — Progress Notes (Signed)
CARDIAC REHAB PHASE I   PRE:  Rate/Rhythm: 69 SR    BP: sitting 130/64    SaO2:   MODE:  Ambulation: 450 ft   POST:  Rate/Rhythm: 89 SR    BP: sitting 134/61     SaO2:   Tolerated well, steady, no c/o. No afib noted. Ed completed with pt and husband. Voiced understanding. Will refer to [facility]. Set up d/c video. [place], ACSM 10/19/2016 1:59 PM

## 2016-10-19 NOTE — Care Management Note (Signed)
Case Management Note Marvetta Gibbons RN, BSN Unit 2W-Case Manager [phone]  Patient Details  Name: [name] MRN: [id number] Date of Birth: 1946-07-11  Subjective/Objective:   Pt admitted s/p CABGx3- tx from ICU to 2W on 10/15/16                 Action/Plan: PTA pt lived at home with spouse- plan to return home with spouse- also has daughter that will be able to assist- no CM needs noted for discharge  Expected Discharge Date:  10/19/16               Expected Discharge Plan:  Home/Self Care  In-House Referral:     Discharge planning Services  CM Consult  Post Acute Care Choice:  NA Choice offered to:  NA  DME Arranged:    DME Agency:     HH Arranged:    [place] Agency:     Status of Service:  Completed, signed off  If discussed at [place] of Stay Meetings, dates discussed:    Discharge Disposition: home/self care   Additional Comments:  Dawayne Patricia, RN 10/19/2016, 11:10 AM

## 2016-10-19 NOTE — Progress Notes (Addendum)
      [address]       [place] [address]             [phone]      8 Days Post-Op Procedure(s) (LRB): CORONARY ARTERY BYPASS GRAFTING (CABG) times three using left internal mammary artery and right leg saphenous vein (N/A) TRANSESOPHAGEAL ECHOCARDIOGRAM (TEE) (N/A)   Subjective:  [name] is feeling better.  She isn't having nausea today.  She wants to go home.  + BM  Objective: Vital signs in last 24 hours: Temp:  [98 F (36.7 C)-98.4 F (36.9 C)] 98.4 F (36.9 C) (02/13 0327) Pulse Rate:  [63-75] 68 (02/13 0327) Cardiac Rhythm: Normal sinus rhythm (02/12 1950) Resp:  [18-20] 18 (02/13 0327) BP: (113-158)/(51-68) 158/62 (02/13 0327) SpO2:  [98 %-100 %] 100 % (02/13 0327) Weight:  [145 lb 8 oz (66 kg)] 145 lb 8 oz (66 kg) (02/13 0327)  Intake/Output from previous day: 02/12 0701 - 02/13 0700 In: 2391.7 [P.O.:480; I.V.:1911.7] Out: 200 [Urine:200]  General appearance: alert, cooperative and no distress Heart: regular rate and rhythm Lungs: clear to auscultation bilaterally Abdomen: soft, non-tender; bowel sounds normal; no masses,  no organomegaly Extremities: edema none apprecited Wound: clean and dry  Lab Results: No results for input(s): WBC, HGB, HCT, PLT in the last 72 hours. BMET:  Recent Labs  10/19/16 0315  NA 137  K 3.8  CL 96*  CO2 28  GLUCOSE 133*  BUN 23*  CREATININE 1.31*  CALCIUM 9.0    PT/INR: No results for input(s): LABPROT, INR in the last 72 hours. ABG    Component Value Date/Time   PHART 7.369 10/11/2016 1902   HCO3 24.1 10/11/2016 1902   TCO2 27 10/12/2016 1814   ACIDBASEDEF 1.0 10/11/2016 1902   O2SAT 99.0 10/11/2016 1902   CBG (last 3)   Recent Labs  10/18/16 1645 10/18/16 2228 10/19/16 0623  GLUCAP 167* 147* 136*    Assessment/Plan: S/P Procedure(s) (LRB): CORONARY ARTERY BYPASS GRAFTING (CABG) times three using left internal mammary artery and right leg saphenous vein (N/A) TRANSESOPHAGEAL  ECHOCARDIOGRAM (TEE) (N/A)  1. CV- PAF, Sinus pauses yesterday- QT down to 427, NSR this morning- continue Amiodarone, Cardizem, remains hypertensive at times 2. Pulm- no acute issues, continue IS 3. Renal- creatinine up to 1.31, weight is at baseline, off Lasix... Will repeat BMET in AM 4. DM- cbgs controlled, continue current regimen 5. Dispo- patient stable, episode of A. Fib this morning, sinus pause yesterday, with creatinine trending up will need to observe patient today, repeat BMET if creatinine remains stable, possibly ready for d/c in AM   LOS: 8 days    Ahmed Prima, ERIN 10/19/2016 Patient feels well and is in sinus rhythm Patient examined and laboratory values reviewed Discharge instructions reviewed with patient and family Plan discharge home later today patient examined and medical record reviewed,agree with above note. Tharon Aquas Trigt III 10/19/2016

## 2016-10-19 NOTE — Progress Notes (Deleted)
Pt not voided since foley removed at 8am. 68cc of urine in bladder. MD paged. Awaiting orders.  Fritz Pickerel, RN

## 2016-10-19 NOTE — Progress Notes (Signed)
Discussed with the patient and all questioned fully answered. She will call me if any problems arise.  IV removed. Telemetry removed.  Pt and family verbalized understanding of all discharge instructions and teaching.  Fritz Pickerel, RN

## 2016-10-22 ENCOUNTER — Encounter: Payer: Self-pay | Admitting: Cardiology

## 2016-10-26 ENCOUNTER — Encounter: Payer: Self-pay | Admitting: Cardiothoracic Surgery

## 2016-10-26 ENCOUNTER — Encounter: Payer: Self-pay | Admitting: *Deleted

## 2016-10-26 ENCOUNTER — Encounter: Payer: Self-pay | Admitting: Hematology & Oncology

## 2016-10-26 ENCOUNTER — Ambulatory Visit (INDEPENDENT_AMBULATORY_CARE_PROVIDER_SITE_OTHER): Payer: Self-pay | Admitting: Cardiothoracic Surgery

## 2016-10-26 ENCOUNTER — Other Ambulatory Visit: Payer: Self-pay | Admitting: Cardiothoracic Surgery

## 2016-10-26 VITALS — BP 130/72 | Resp 16 | Ht 64.0 in | Wt 140.0 lb

## 2016-10-26 DIAGNOSIS — Z951 Presence of aortocoronary bypass graft: Secondary | ICD-10-CM

## 2016-10-26 DIAGNOSIS — I2510 Atherosclerotic heart disease of native coronary artery without angina pectoris: Secondary | ICD-10-CM

## 2016-10-26 NOTE — Progress Notes (Signed)
PCP is [name], PA-C Referring Provider is Belva Crome, MD  Chief Complaint  Patient presents with  . Routine Post Op    s/p CABG X 3 10/11/16...concerned with EVH site    HPI: Patient returns for follow-up of leg wound 2 weeks after multivessel CABG. The patient is a diabetic with an insulin pump She noticed drainage from her right leg  at the endoscopic vein harvest site The skin surrounding the right thigh tunnel is inflamed and tender consistent with cellulitis There is a subcutaneous abscess which was incised and drained in the office cultures obtained and packed with 1 inch Nu Gauze saline wet-to-dry. This extends both superiorly and inferiorly in the Endo vein tunnel.  Past Medical History:  Diagnosis Date  . Arthritis   . Cancer [facility])    colon cancer  . Coronary artery disease   . Diabetes mellitus without complication ([place])   . GERD (gastroesophageal reflux disease)   . Heart murmur   . History of blood transfusion   . History of kidney stones   . Hyperlipidemia   . Hypertension   . Iron deficiency anemia due to chronic blood loss 09/17/2016  . 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 PONV (postoperative nausea and vomiting)    gets nauseated easily    Past Surgical History:  Procedure Laterality Date  . ABDOMINAL HYSTERECTOMY    . APPENDECTOMY    . CARDIAC CATHETERIZATION N/A 09/30/2016   Procedure: Left Heart Cath and Coronary Angiography;  Surgeon: Belva Crome, MD;  Location: [place] CV [facility];  Service: Cardiovascular;  Laterality: N/A;  . CHOLECYSTECTOMY    . COLONOSCOPY    . CORONARY ARTERY BYPASS GRAFT N/A 10/11/2016   Procedure: CORONARY ARTERY BYPASS GRAFTING (CABG) times three using left internal mammary artery and right leg saphenous vein;  Surgeon: Ivin Poot, MD;  Location: [place];  Service: Open Heart Surgery;  Laterality: N/A;  . TEE WITHOUT CARDIOVERSION N/A 10/11/2016   Procedure: TRANSESOPHAGEAL ECHOCARDIOGRAM (TEE);  Surgeon: Ivin Poot,  MD;  Location: [place];  Service: Open Heart Surgery;  Laterality: N/A;    Family History  Problem Relation Age of Onset  . Heart attack Mother   . Diabetes Mother   . Heart attack Father     Social History Social History  Substance Use Topics  . Smoking status: Never Smoker  . Smokeless tobacco: Never Used  . Alcohol use No    Current Outpatient Prescriptions  Medication Sig Dispense Refill  . Acetaminophen (TYLENOL 8 HOUR ARTHRITIS PAIN PO) Take 2 tablets by mouth every 8 (eight) hours as needed (arthritis).     Marland Kitchen amiodarone (PACERONE) 200 MG tablet Take 1 tablet (200 mg total) by mouth 2 (two) times daily. 60 tablet 1  . aspirin EC 81 MG EC tablet Take 1 tablet (81 mg total) by mouth daily.    Marland Kitchen atorvastatin (LIPITOR) 20 MG tablet Take 20 mg by mouth daily.     . Calcium Carbonate-Vitamin D (CALTRATE 600+D PO) Take 1 tablet by mouth daily.     Marland Kitchen diltiazem (CARDIZEM SR) 60 MG 12 hr capsule Take 1 capsule (60 mg total) by mouth every 12 (twelve) hours. 60 capsule 3  . Insulin Human (INSULIN PUMP) SOLN Inject 1.5-5.5 each into the skin 3 (three) times daily. humalog    . Lidocaine-Menthol (ZIMS MAX-FREEZE PAIN RELIEF EX) Apply 1 application topically daily as needed (PAIN).    Marland Kitchen metFORMIN (GLUCOPHAGE-XR) 500 MG 24 hr tablet Take  two tablets (1,000 mg total) by mouth 2 (two) times daily with meals.  5  . omeprazole (PRILOSEC) 40 MG capsule Take 40 mg by mouth 2 (two) times daily.    . traMADol (ULTRAM) 50 MG tablet Take 1-2 tablets (50-100 mg total) by mouth every 4 (four) hours as needed for moderate pain. 30 tablet 0  . valsartan (DIOVAN) 40 MG tablet Take 1 tablet (40 mg total) by mouth daily. 30 tablet 3  . Vitamin D, Cholecalciferol, 400 units CAPS Take 800 Units by mouth daily.     Vanessa Kick Ethyl 1 g CAPS Take 2 g by mouth 2 (two) times daily. (Patient not taking: Reported on 10/07/2016) 120 capsule 4   No current facility-administered medications for this visit.      Allergies  Allergen Reactions  . Hydrochlorothiazide     Other reaction(s): Other HYPERCALCEMIA  . Losartan Potassium     Other reaction(s): Other Increased eosinophils    Review of Systems  No fever Sternal incision healing well Remains in sinus rhythm  BP 130/72 (BP Location: Left Arm, Patient Position: Sitting, Cuff Size: Large)   Resp 16   Ht 5\' 4"  (1.626 m)   Wt 140 lb (63.5 kg)   SpO2 99% Comment: ON RA  BMI 24.03 kg/m  Physical Exam      Exam    General- alert and comfortable   Lungs- clear without rales, wheezes   Cor- regular rate and rhythm, no murmur , gallop   Abdomen- soft, non-tender   Extremities - right leg with Endo vein tunnel abscess and cellulitis of the soft tissue   Neuro- oriented, appropriate, no focal weakness   Diagnostic Tests: Wound culture obtained  Impression: Home health nurse will be consulted for daily packing with Nu Gauze 1 inch wet-to-dry with saline Oral Keflex will be initiated pending results of wound culture Patient  To return for wound check on Monday February 26  Plan: Return to office for wound check favored [place] III, MD [facility] [phone]

## 2016-10-26 NOTE — Progress Notes (Signed)
[name] was senn in the office today s/p CABG. She has developed cellulitis and an abscess of her right thigh, the site of her [facility] site. Dr. Prescott Gum I and D'd the area, expressed old blood and packed the site with 1/2" plain Nu Gauze. A referral was made to [place] to continue daily wound care to the site.

## 2016-10-27 DIAGNOSIS — T814XXA Infection following a procedure, initial encounter: Secondary | ICD-10-CM | POA: Diagnosis not present

## 2016-10-27 DIAGNOSIS — Z951 Presence of aortocoronary bypass graft: Secondary | ICD-10-CM | POA: Diagnosis not present

## 2016-10-29 LAB — CULTURE, ROUTINE-ABSCESS
Gram Stain: NONE SEEN
Gram Stain: NONE SEEN

## 2016-11-01 ENCOUNTER — Ambulatory Visit (INDEPENDENT_AMBULATORY_CARE_PROVIDER_SITE_OTHER): Payer: Self-pay | Admitting: Physician Assistant

## 2016-11-01 VITALS — BP 117/59 | Resp 16 | Ht 64.0 in | Wt 140.0 lb

## 2016-11-01 DIAGNOSIS — Z951 Presence of aortocoronary bypass graft: Secondary | ICD-10-CM

## 2016-11-01 DIAGNOSIS — I2510 Atherosclerotic heart disease of native coronary artery without angina pectoris: Secondary | ICD-10-CM

## 2016-11-01 DIAGNOSIS — L02415 Cutaneous abscess of right lower limb: Secondary | ICD-10-CM

## 2016-11-01 MED ORDER — CIPROFLOXACIN HCL 500 MG PO TABS: 500.0000 mg | ORAL_TABLET | Freq: Two times a day (BID) | ORAL | 0 refills | Status: AC

## 2016-11-01 NOTE — Progress Notes (Signed)
HPI:  Patient returns for wound follow up.  She is S/P CABG x 3 on 10/11/2016.  She presented to the office on 10/26/2016 with complains of drainage from her right leg at her [facility] site.  She denied fevers.  Dr. Prescott Gum saw the patient and felt there was a subcutaneous abscess with cellulitis.  This was I/D and cultures were obtained.  She was treated with Keflex.  She presented today for wound follow up.  The patient states her leg is doing better.  She remains on her previously described ABX.  The home health nurse has been performing her daily wound dressings.   Current Outpatient Prescriptions  Medication Sig Dispense Refill  . Acetaminophen (TYLENOL 8 HOUR ARTHRITIS PAIN PO) Take 2 tablets by mouth every 8 (eight) hours as needed (arthritis).     Marland Kitchen amiodarone (PACERONE) 200 MG tablet Take 1 tablet (200 mg total) by mouth 2 (two) times daily. 60 tablet 1  . aspirin EC 81 MG EC tablet Take 1 tablet (81 mg total) by mouth daily.    Marland Kitchen atorvastatin (LIPITOR) 20 MG tablet Take 20 mg by mouth daily.     . Calcium Carbonate-Vitamin D (CALTRATE 600+D PO) Take 1 tablet by mouth daily.     Marland Kitchen diltiazem (CARDIZEM SR) 60 MG 12 hr capsule Take 1 capsule (60 mg total) by mouth every 12 (twelve) hours. 60 capsule 3  . Insulin Human (INSULIN PUMP) SOLN Inject 1.5-5.5 each into the skin 3 (three) times daily. humalog    . Lidocaine-Menthol (ZIMS MAX-FREEZE PAIN RELIEF EX) Apply 1 application topically daily as needed (PAIN).    Marland Kitchen metFORMIN (GLUCOPHAGE-XR) 500 MG 24 hr tablet Take two tablets (1,000 mg total) by mouth 2 (two) times daily with meals.  5  . omeprazole (PRILOSEC) 40 MG capsule Take 40 mg by mouth 2 (two) times daily.    . traMADol (ULTRAM) 50 MG tablet Take 1-2 tablets (50-100 mg total) by mouth every 4 (four) hours as needed for moderate pain. 30 tablet 0  . valsartan (DIOVAN) 40 MG tablet Take 1 tablet (40 mg total) by mouth daily. 30 tablet 3  . Vitamin D, Cholecalciferol, 400 units CAPS Take 800  Units by mouth daily.     . ciprofloxacin (CIPRO) 500 MG tablet Take 1 tablet (500 mg total) by mouth 2 (two) times daily. 20 tablet 0  . Icosapent Ethyl 1 g CAPS Take 2 g by mouth 2 (two) times daily. (Patient not taking: Reported on 10/07/2016) 120 capsule 4   No current facility-administered medications for this visit.     Physical Exam:  BP (!) 117/59 (BP Location: Right Arm, Patient Position: Sitting, Cuff Size: Large)   Resp 16   Ht 5\' 4"  (1.626 m)   Wt 140 lb (63.5 kg)   SpO2 98% Comment: ON RA  BMI 24.03 kg/m   Gen: no apparent distress Heart: RRR Lungs: CTA  Wound: RLE remains ecchymotic, erythema improving, no purulence notced  Diagnostic Tests:  Wound Culture- resulted Pseudomonas Aeruginosa  A/P:  1. Right lower extremity abscess/cellultiis- will stop Keflex, start Ciprofloxacin based on Sensitivities 2. CV- patient with A. Fib in hospital on Amiodarone... With addition of Cipro need to monitor QT interval.  We will obtain strip at follow up appointment.  Will also notify cardiology who will see patient prior to [place] to assess QT as well 3. RTC on 3/7 with Dr. Osa Craver, PA-C [facility] [phone]

## 2016-11-02 ENCOUNTER — Ambulatory Visit (INDEPENDENT_AMBULATORY_CARE_PROVIDER_SITE_OTHER): Payer: Medicare Other | Admitting: Cardiology

## 2016-11-02 ENCOUNTER — Encounter: Payer: Medicare Other | Admitting: Cardiology

## 2016-11-02 ENCOUNTER — Ambulatory Visit (INDEPENDENT_AMBULATORY_CARE_PROVIDER_SITE_OTHER): Payer: Medicare Other | Admitting: Physician Assistant

## 2016-11-02 ENCOUNTER — Encounter: Payer: Self-pay | Admitting: Physician Assistant

## 2016-11-02 ENCOUNTER — Encounter: Payer: Self-pay | Admitting: Cardiology

## 2016-11-02 VITALS — BP 138/76 | HR 67 | Ht 64.0 in | Wt 139.0 lb

## 2016-11-02 VITALS — BP 154/74 | HR 76 | Ht 64.0 in | Wt 138.0 lb

## 2016-11-02 DIAGNOSIS — I2510 Atherosclerotic heart disease of native coronary artery without angina pectoris: Secondary | ICD-10-CM

## 2016-11-02 DIAGNOSIS — E118 Type 2 diabetes mellitus with unspecified complications: Secondary | ICD-10-CM | POA: Diagnosis not present

## 2016-11-02 DIAGNOSIS — L0291 Cutaneous abscess, unspecified: Secondary | ICD-10-CM | POA: Diagnosis not present

## 2016-11-02 DIAGNOSIS — C182 Malignant neoplasm of ascending colon: Secondary | ICD-10-CM

## 2016-11-02 DIAGNOSIS — Z794 Long term (current) use of insulin: Secondary | ICD-10-CM

## 2016-11-02 DIAGNOSIS — Z951 Presence of aortocoronary bypass graft: Secondary | ICD-10-CM

## 2016-11-02 NOTE — Progress Notes (Signed)
11/02/2016 [name]   May 07, 1946  [id number]  Primary Physician [name], PA-C Primary Cardiologist: Dr. Johnsie Cancel    Reason for Visit/CC: Hospital F/u for CAD s/p CABG  HPI:  [name] is a 71 y/o female who presents to clinic for post hospital f/u.   She has a h/o IDDM, HTN, HLD and was recently diagnosed with cecal cancer. She was referred to Dr. Johnsie Cancel for pre-operative assessment. Given her risk factors, he ordered for her to undergo nuclear stress testing. Her stress test 09/24/16 was intermediate risk with a small defect of mild severity present in the basal anteroseptal location. This defect is partially reversible. Subsequently, she was referred for [facility] and was found to have severe multivessel CAD with preserved LVF. Surgical revascularization was recommended.   On 10/08/16, she underwent CABG x 3, per Dr. Prescott Gum, with a LIMA-LAD, SVG-diag and SVG to circumflex marginal. Post operative course was complicated by atrial fibrillation w/ RVR, requiring amiodarone. She converted to sinus bradycardia. EP was consulted as she had issues with post termination pauses of 2-3 seconds. EP felt best for her to continue amiodarone. It was felt that her pauses/ bradycardia would resolve. She was felt not a candidate for coumadin given her cecal mass.   She was discharge home on 10/19/16. After discharge, she developed LE cellulitis around her vein harvest site. Dr. Prescott Gum started her on antibiotic therapy with Keflex. This is improving. She has f/u again with Dr. Prescott Gum on 3/6.   She presents back to our office for general cardiology f/u. She reports that she is progressing well. No angina. No dyspnea. She notes occasional palpitations, mainly at night. No syncope/ near syncope. Currently asymptomatic. Her LEE wound is healing. She denies any rectal bleeding/ melena. VSS. EKG shows NSR. HR 67 bpm. No pauses. She was told by CT surgery that she may possibly be able to undergo surgery for  her cecal cancer in May.    Current Meds  Medication Sig  . Acetaminophen (TYLENOL 8 HOUR ARTHRITIS PAIN PO) Take 2 tablets by mouth every 8 (eight) hours as needed (arthritis).   Marland Kitchen amiodarone (PACERONE) 200 MG tablet Take 1 tablet (200 mg total) by mouth 2 (two) times daily.  Marland Kitchen aspirin EC 81 MG EC tablet Take 1 tablet (81 mg total) by mouth daily.  Marland Kitchen atorvastatin (LIPITOR) 20 MG tablet Take 20 mg by mouth daily.   . Calcium Carbonate-Vitamin D (CALTRATE 600+D PO) Take 1 tablet by mouth daily.   . ciprofloxacin (CIPRO) 500 MG tablet Take 1 tablet (500 mg total) by mouth 2 (two) times daily.  Marland Kitchen diltiazem (CARDIZEM SR) 60 MG 12 hr capsule Take 1 capsule (60 mg total) by mouth every 12 (twelve) hours.  Vanessa Kick Ethyl 1 g CAPS Take 2 g by mouth 2 (two) times daily.  . Insulin Human (INSULIN PUMP) SOLN Inject 1.5-5.5 each into the skin 3 (three) times daily. humalog  . Lidocaine-Menthol (ZIMS MAX-FREEZE PAIN RELIEF EX) Apply 1 application topically daily as needed (PAIN).  Marland Kitchen metFORMIN (GLUCOPHAGE-XR) 500 MG 24 hr tablet Take two tablets (1,000 mg total) by mouth 2 (two) times daily with meals.  Marland Kitchen omeprazole (PRILOSEC) 40 MG capsule Take 40 mg by mouth 2 (two) times daily.  . traMADol (ULTRAM) 50 MG tablet Take 1-2 tablets (50-100 mg total) by mouth every 4 (four) hours as needed for moderate pain.  . valsartan (DIOVAN) 40 MG tablet Take 1 tablet (40 mg total) by mouth daily.  Marland Kitchen  Vitamin D, Cholecalciferol, 400 units CAPS Take 800 Units by mouth daily.    Allergies  Allergen Reactions  . Hydrochlorothiazide     Other reaction(s): Other HYPERCALCEMIA  . Losartan Potassium     Other reaction(s): Other Increased eosinophils   Past Medical History:  Diagnosis Date  . Arthritis   . Cancer [facility])    colon cancer  . Coronary artery disease   . Diabetes mellitus without complication ([place])   . GERD (gastroesophageal reflux disease)   . Heart murmur   . History of blood transfusion   .  History of kidney stones   . Hyperlipidemia   . Hypertension   . Iron deficiency anemia due to chronic blood loss 09/17/2016  . 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 PONV (postoperative nausea and vomiting)    gets nauseated easily   Family History  Problem Relation Age of Onset  . Heart attack Mother   . Diabetes Mother   . Heart attack Father    Past Surgical History:  Procedure Laterality Date  . ABDOMINAL HYSTERECTOMY    . APPENDECTOMY    . CARDIAC CATHETERIZATION N/A 09/30/2016   Procedure: Left Heart Cath and Coronary Angiography;  Surgeon: Belva Crome, MD;  Location: [place] CV LAB;  Service: Cardiovascular;  Laterality: N/A;  . CHOLECYSTECTOMY    . COLONOSCOPY    . CORONARY ARTERY BYPASS GRAFT N/A 10/11/2016   Procedure: CORONARY ARTERY BYPASS GRAFTING (CABG) times three using left internal mammary artery and right leg saphenous vein;  Surgeon: Ivin Poot, MD;  Location: [place];  Service: Open Heart Surgery;  Laterality: N/A;  . TEE WITHOUT CARDIOVERSION N/A 10/11/2016   Procedure: TRANSESOPHAGEAL ECHOCARDIOGRAM (TEE);  Surgeon: Ivin Poot, MD;  Location: [place];  Service: Open Heart Surgery;  Laterality: N/A;   Social History   Social History  . Marital status: Married    Spouse name: N/A  . Number of children: N/A  . Years of education: N/A   Occupational History  . Not on file.   Social History Main Topics  . Smoking status: Never Smoker  . Smokeless tobacco: Never Used  . Alcohol use No  . Drug use: No  . Sexual activity: No   Other Topics Concern  . Not on file   Social History Narrative  . No narrative on file     Review of Systems: General: negative for chills, fever, night sweats or weight changes.  Cardiovascular: negative for chest pain, dyspnea on exertion, edema, orthopnea, palpitations, paroxysmal nocturnal dyspnea or shortness of breath Dermatological: negative for rash Respiratory: negative for cough or wheezing Urologic: negative for  hematuria Abdominal: negative for nausea, vomiting, diarrhea, bright red blood per rectum, melena, or hematemesis Neurologic: negative for visual changes, syncope, or dizziness All other systems reviewed and are otherwise negative except as noted above.   Physical Exam:  Blood pressure 138/76, pulse 67, height 5\' 4"  (1.626 m), weight 139 lb (63 kg), SpO2 98 %.  General appearance: alert, cooperative and no distress Neck: no carotid bruit and no JVD Lungs: clear to auscultation bilaterally Heart: regular rate and rhythm and 2/6 SM, loudest at LUSB Extremities: extremities normal, atraumatic, no cyanosis or edema Pulses: 2+ and symmetric Skin: Skin color, texture, turgor normal. No rashes or lesions Neurologic: Grossly normal  EKG NSR. 67 bpm. No pauses   ASSESSMENT AND PLAN:   1. CAD: s/p CABG x 3, per Dr. Prescott Gum, with a LIMA-LAD, SVG-diag and SVG to circumflex marginal,  10/08/16. Stable w/o angina.  Continue ASA and statin.   2. Post-Operative Atrial Fibrillation w/ intermittent post terminal pauses: currently in NSR on EKG. HR in the upper 60s. No pauses noted. She notes occasional palpitations at night but no syncope/ near syncope. EP recommended continuation of amiodarone. Not a candidate for a/c given cecal mass.   3. Cecal Cancer: waiting for surgery. Was told by CT surgery that she may possibly be able to undergo surgery in May.  4. HTN: well controlled on current regimen   5. HLD: continue statin therapy with Lipitor  6. IDDM: has insulin pump. Followed by PCP.   7. Rt LE wound + cellulitis: infection surrounding her vein harvest sight. Improving with antibiotics. CT surgery following.   PLAN  F/u with Dr. Johnsie Cancel in 6-8 weeks.   Lyda Jester PA-C 11/02/2016 1:51 PM

## 2016-11-02 NOTE — Progress Notes (Signed)
   Subjective:    Patient ID: [name], female    DOB: 1945/10/03, 71 y.o.   MRN: [id number]  HPI  Pt is a 71 yo female who returns for hospital and wound follow up.  She is S/P CABG x 3 on 10/11/2016 after being sent for further cardiac clearance by our office.  She presented to her cardiologist office on 10/26/2016 with complains of drainage from her right leg at her [facility] site.  She denied fevers.  Dr. Prescott Gum saw the patient and felt there was a subcutaneous abscess with cellulitis.  This was I/D and cultures were obtained.  She was treated with Keflex.  She presented yesterday back to cardiology office for wound follow up.  The patient states her leg is doing better.  She remains on her previously described ABX.  The home health nurse has been performing her daily wound dressings. Home health is not coming today because she has this appt. Next appt with cardiology is on 3/7.  She does feel like wound is improving daily.   They are shooting for colon cancer removal in may of this year.   Dr. Hartford Poli is controlling sugars.    Review of Systems  All other systems reviewed and are negative.      Objective:   Physical Exam  Constitutional: She is oriented to person, place, and time. She appears well-developed and well-nourished.  HENT:  Head: Normocephalic and atraumatic.  Cardiovascular: Normal rate, regular rhythm and normal heart sounds.   Pulmonary/Chest: Effort normal and breath sounds normal.  Neurological: She is alert and oriented to person, place, and time.  Skin:  RLE- 1cm by 1cm by 1cm wound. Minimal drainage. No smell or warmth. There is some ecchymosis present around wound. Tenderness to touch.   Packing removed today and placed with new packing.   Psychiatric: She has a normal mood and affect. Her behavior is normal.          Assessment & Plan:  [name] was seen today for hospitalization follow-up.  Diagnoses and all orders for this visit:  Coronary artery  disease involving native coronary artery of native heart without angina pectoris  Abscess  Malignant neoplasm of ascending colon ([place])  Type 2 diabetes mellitus with complication, with long-term current use of insulin ([place])   Follow up with cardiology on 3/7.   Follow up regularly with Dr. Hartford Poli, endocrinology.   Abscess/cellulitis is healing well. Took out packing and placed new packing with clean bandage. Home health will come to home tomorrow.

## 2016-11-02 NOTE — Patient Instructions (Addendum)
Medication Instructions:  Your physician recommends that you continue on your current medications as directed. Please refer to the Current Medication list given to you today.   Labwork: None ordered  Testing/Procedures: None ordered  Follow-Up: Your physician recommends that you schedule a follow-up appointment in: 12/24/16 WITH DR. Johnsie Cancel ARRIVE AT 2:00    Any Other Special Instructions Will Be Listed Below (If Applicable).   If you need a refill on your cardiac medications before your next appointment, please call your pharmacy.

## 2016-11-09 ENCOUNTER — Ambulatory Visit (INDEPENDENT_AMBULATORY_CARE_PROVIDER_SITE_OTHER): Payer: Self-pay | Admitting: Cardiothoracic Surgery

## 2016-11-09 ENCOUNTER — Encounter: Payer: Self-pay | Admitting: Cardiothoracic Surgery

## 2016-11-09 VITALS — BP 118/65 | HR 58 | Resp 16 | Ht 64.0 in | Wt 139.0 lb

## 2016-11-09 DIAGNOSIS — Z951 Presence of aortocoronary bypass graft: Secondary | ICD-10-CM

## 2016-11-09 DIAGNOSIS — L02415 Cutaneous abscess of right lower limb: Secondary | ICD-10-CM

## 2016-11-09 NOTE — Progress Notes (Signed)
PCP is [name], PA-C Referring Provider is Belva Crome, MD  Chief Complaint  Patient presents with  . Routine Post Op    recheck right thigh wound    HPI: The patient returns for wound check one month after multivessel CABG. The patient developed a infection in the right thigh Endo vein harvest tunnel which grew out Pseudomonas. This was incised and drained and packed and treated with oral Cipro. The infection has now almost healed. There is no tunnel the pack. There is only a slight defect in the skin which will be packed with a part of a 2 x 2 wet-to-dry gauze. The wound is 100% granulation tissue no surrounding cellulitis. The patient will stop her antibiotic when the prescription is completed.  We discussed the patient's activity level-she may drive and do light housework with lifting limit up to 15 pounds.  The patient is in a sinus rhythm with slow heart rate and we'll reduce her amiodarone to a single 200 mg tablet daily.  Past Medical History:  Diagnosis Date  . Arthritis   . Cancer [facility])    colon cancer  . Coronary artery disease   . Diabetes mellitus without complication ([place])   . GERD (gastroesophageal reflux disease)   . Heart murmur   . History of blood transfusion   . History of kidney stones   . Hyperlipidemia   . Hypertension   . Iron deficiency anemia due to chronic blood loss 09/17/2016  . 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 PONV (postoperative nausea and vomiting)    gets nauseated easily    Past Surgical History:  Procedure Laterality Date  . ABDOMINAL HYSTERECTOMY    . APPENDECTOMY    . CARDIAC CATHETERIZATION N/A 09/30/2016   Procedure: Left Heart Cath and Coronary Angiography;  Surgeon: Belva Crome, MD;  Location: [place] CV LAB;  Service: Cardiovascular;  Laterality: N/A;  . CHOLECYSTECTOMY    . COLONOSCOPY    . CORONARY ARTERY BYPASS GRAFT N/A 10/11/2016   Procedure: CORONARY ARTERY BYPASS GRAFTING (CABG) times three using left internal  mammary artery and right leg saphenous vein;  Surgeon: Ivin Poot, MD;  Location: [place];  Service: Open Heart Surgery;  Laterality: N/A;  . TEE WITHOUT CARDIOVERSION N/A 10/11/2016   Procedure: TRANSESOPHAGEAL ECHOCARDIOGRAM (TEE);  Surgeon: Ivin Poot, MD;  Location: [place];  Service: Open Heart Surgery;  Laterality: N/A;    Family History  Problem Relation Age of Onset  . Heart attack Mother   . Diabetes Mother   . Heart attack Father     Social History Social History  Substance Use Topics  . Smoking status: Never Smoker  . Smokeless tobacco: Never Used  . Alcohol use No    Current Outpatient Prescriptions  Medication Sig Dispense Refill  . Acetaminophen (TYLENOL 8 HOUR ARTHRITIS PAIN PO) Take 2 tablets by mouth every 8 (eight) hours as needed (arthritis).     Marland Kitchen amiodarone (PACERONE) 200 MG tablet Take 1 tablet (200 mg total) by mouth 2 (two) times daily. 60 tablet 1  . aspirin EC 81 MG EC tablet Take 1 tablet (81 mg total) by mouth daily.    Marland Kitchen atorvastatin (LIPITOR) 20 MG tablet Take 20 mg by mouth daily.     . Calcium Carbonate-Vitamin D (CALTRATE 600+D PO) Take 1 tablet by mouth daily.     . ciprofloxacin (CIPRO) 500 MG tablet Take 1 tablet (500 mg total) by mouth 2 (two) times daily. 20 tablet 0  .  diltiazem (CARDIZEM SR) 60 MG 12 hr capsule Take 1 capsule (60 mg total) by mouth every 12 (twelve) hours. 60 capsule 3  . Icosapent Ethyl 1 g CAPS Take 2 g by mouth 2 (two) times daily. 120 capsule 4  . Insulin Human (INSULIN PUMP) SOLN Inject 1.5-5.5 each into the skin 3 (three) times daily. humalog    . Lidocaine-Menthol (ZIMS MAX-FREEZE PAIN RELIEF EX) Apply 1 application topically daily as needed (PAIN).    Marland Kitchen metFORMIN (GLUCOPHAGE-XR) 500 MG 24 hr tablet Take two tablets (1,000 mg total) by mouth 2 (two) times daily with meals.  5  . omeprazole (PRILOSEC) 40 MG capsule Take 40 mg by mouth 2 (two) times daily.    . traMADol (ULTRAM) 50 MG tablet Take 1-2 tablets (50-100  mg total) by mouth every 4 (four) hours as needed for moderate pain. 30 tablet 0  . valsartan (DIOVAN) 40 MG tablet Take 1 tablet (40 mg total) by mouth daily. 30 tablet 3  . Vitamin D, Cholecalciferol, 400 units CAPS Take 800 Units by mouth daily.      No current facility-administered medications for this visit.     Allergies  Allergen Reactions  . Hydrochlorothiazide     Other reaction(s): Other HYPERCALCEMIA  . Losartan Potassium     Other reaction(s): Other Increased eosinophils    Review of Systems   Still not much appetite but overall strength improved Anxious to increase activity levels No edema shortness of breath or chest pain  BP 118/65 (BP Location: Left Arm, Patient Position: Sitting, Cuff Size: Large)   Pulse (!) 58   Resp 16   Ht 5\' 4"  (1.626 m)   Wt 139 lb (63 kg)   SpO2 98% Comment: ON RA  BMI 23.86 kg/m  Physical Exam      Exam    General- alert and comfortable   Lungs- clear without rales, wheezes   Cor- regular rate and rhythm, no murmur , gallop   Abdomen- soft, non-tender   Extremities - warm, non-tender, minimal edema   Neuro- oriented, appropriate, no focal weakness   Diagnostic Tests: None  Impression: Doing well after multivessel CABG Soft tissue infection in the right thigh subcutaneous vein harvest site is healing nicely and should be healed by next office visit with continued wound care and antibiotic  Plan: Decrease amiodarone dose 200 mg by mouth daily Daily wet-to-dry dressing changes Return for office visit and assessment in one week  Len Childs, MD [facility] [phone]

## 2016-11-15 ENCOUNTER — Ambulatory Visit: Payer: Medicare Other | Admitting: Physician Assistant

## 2016-11-15 ENCOUNTER — Other Ambulatory Visit: Payer: Self-pay | Admitting: Cardiothoracic Surgery

## 2016-11-15 DIAGNOSIS — Z951 Presence of aortocoronary bypass graft: Secondary | ICD-10-CM

## 2016-11-17 ENCOUNTER — Ambulatory Visit (INDEPENDENT_AMBULATORY_CARE_PROVIDER_SITE_OTHER): Payer: Self-pay | Admitting: Cardiothoracic Surgery

## 2016-11-17 ENCOUNTER — Encounter: Payer: Self-pay | Admitting: *Deleted

## 2016-11-17 ENCOUNTER — Ambulatory Visit
Admission: RE | Admit: 2016-11-17 | Discharge: 2016-11-17 | Disposition: A | Discharge status: Home / Self Care | Payer: Medicare Other | Source: Ambulatory Visit | Attending: Cardiothoracic Surgery | Admitting: Cardiothoracic Surgery

## 2016-11-17 ENCOUNTER — Other Ambulatory Visit: Payer: Self-pay | Admitting: Physician Assistant

## 2016-11-17 VITALS — BP 130/60 | HR 80 | Resp 20 | Ht 64.0 in | Wt 139.0 lb

## 2016-11-17 DIAGNOSIS — Z951 Presence of aortocoronary bypass graft: Secondary | ICD-10-CM

## 2016-11-17 DIAGNOSIS — I2510 Atherosclerotic heart disease of native coronary artery without angina pectoris: Secondary | ICD-10-CM

## 2016-11-17 NOTE — Progress Notes (Signed)
A referral was faxed to [place] to contact her to begin Phase II per the request of Dr. Prescott Gum.

## 2016-11-17 NOTE — Progress Notes (Signed)
PCP is [name], PA-C Referring Provider is Belva Crome, MD  Chief Complaint  Patient presents with  . Routine Post Op    s/p CABG X 3 with a CXR....ck R thigh wound    HPI: Patient returns for wound check-gram-negative rod infection of right thigh and a vein tunnel treated with incision and drainage packing and oral Cipro  The tunnel has completely healed. There is still some indurated scar under the skin in the tunnel. This is nontender and does not appear to be infected.  The patient will stop trying to pack the wound and we'll just simply apply Neosporin and a Band-Aid to the small area of skin ulceration. Otherwise the patient is progressing well and will be starting cardiac rehabilitation in the near future hopefully  Past Medical History:  Diagnosis Date  . Arthritis   . Cancer [facility])    colon cancer  . Coronary artery disease   . Diabetes mellitus without complication ([place])   . GERD (gastroesophageal reflux disease)   . Heart murmur   . History of blood transfusion   . History of kidney stones   . Hyperlipidemia   . Hypertension   . Iron deficiency anemia due to chronic blood loss 09/17/2016  . 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 PONV (postoperative nausea and vomiting)    gets nauseated easily    Past Surgical History:  Procedure Laterality Date  . ABDOMINAL HYSTERECTOMY    . APPENDECTOMY    . CARDIAC CATHETERIZATION N/A 09/30/2016   Procedure: Left Heart Cath and Coronary Angiography;  Surgeon: Belva Crome, MD;  Location: [place] CV LAB;  Service: Cardiovascular;  Laterality: N/A;  . CHOLECYSTECTOMY    . COLONOSCOPY    . CORONARY ARTERY BYPASS GRAFT N/A 10/11/2016   Procedure: CORONARY ARTERY BYPASS GRAFTING (CABG) times three using left internal mammary artery and right leg saphenous vein;  Surgeon: Ivin Poot, MD;  Location: [place];  Service: Open Heart Surgery;  Laterality: N/A;  . TEE WITHOUT CARDIOVERSION N/A 10/11/2016   Procedure:  TRANSESOPHAGEAL ECHOCARDIOGRAM (TEE);  Surgeon: Ivin Poot, MD;  Location: [place];  Service: Open Heart Surgery;  Laterality: N/A;    Family History  Problem Relation Age of Onset  . Heart attack Mother   . Diabetes Mother   . Heart attack Father     Social History Social History  Substance Use Topics  . Smoking status: Never Smoker  . Smokeless tobacco: Never Used  . Alcohol use No    Current Outpatient Prescriptions  Medication Sig Dispense Refill  . Acetaminophen (TYLENOL 8 HOUR ARTHRITIS PAIN PO) Take 2 tablets by mouth every 8 (eight) hours as needed (arthritis).     Marland Kitchen amiodarone (PACERONE) 200 MG tablet Take 200 mg by mouth daily.    Marland Kitchen aspirin EC 81 MG EC tablet Take 1 tablet (81 mg total) by mouth daily.    Marland Kitchen atorvastatin (LIPITOR) 20 MG tablet TAKE 1 TABLET DAILY 90 tablet 3  . Calcium Carbonate-Vitamin D (CALTRATE 600+D PO) Take 1 tablet by mouth daily.     Marland Kitchen diltiazem (CARDIZEM SR) 60 MG 12 hr capsule Take 1 capsule (60 mg total) by mouth every 12 (twelve) hours. 60 capsule 3  . Icosapent Ethyl 1 g CAPS Take 2 g by mouth 2 (two) times daily. 120 capsule 4  . Insulin Human (INSULIN PUMP) SOLN Inject 1.5-5.5 each into the skin 3 (three) times daily. humalog    . Lidocaine-Menthol (ZIMS MAX-FREEZE PAIN  RELIEF EX) Apply 1 application topically daily as needed (PAIN).    Marland Kitchen metFORMIN (GLUCOPHAGE-XR) 500 MG 24 hr tablet 500 mg BID  5  . omeprazole (PRILOSEC) 40 MG capsule Take 40 mg by mouth 2 (two) times daily.    . traMADol (ULTRAM) 50 MG tablet Take 1-2 tablets (50-100 mg total) by mouth every 4 (four) hours as needed for moderate pain. 30 tablet 0  . valsartan (DIOVAN) 40 MG tablet Take 1 tablet (40 mg total) by mouth daily. 30 tablet 3  . Vitamin D, Cholecalciferol, 400 units CAPS Take 800 Units by mouth daily.      No current facility-administered medications for this visit.     Allergies  Allergen Reactions  . Hydrochlorothiazide     Other reaction(s):  Other HYPERCALCEMIA  . Losartan Potassium     Other reaction(s): Other Increased eosinophils    Review of Systems No fever Improved appetite and strength Right leg wound significantly improved No symptoms of angina  BP 130/60   Pulse 80   Resp 20   Ht 5\' 4"  (1.626 m)   Wt 139 lb (63 kg)   SpO2 98% Comment: RA  BMI 23.86 kg/m  Physical Exam      Exam    General- alert and comfortable   Lungs- clear without rales, wheezes   Cor- regular rate and rhythm, no murmur , gallop   Abdomen- soft, non-tender   Extremities - warm, non-tender, minimal edema   Neuro- oriented, appropriate, no focal weakness Right leg dressing personally changed and examined showing healing of the subclavius total leaving only a small skin ulceration  Diagnostic Tests: Chest x-ray today is clear  Impression: Leg wound is healing Patient is recovering from CABG She will start cardiac rehabilitation as she needs to recover and gain strength for upcoming right colectomy for a neoplasm. This is tentatively set for after May 1  Plan return in 3-4 weeks for wound check and general assessment. Patient knows she should not lift more than 20 pounds until 3 months after surgery.:   Len Childs, MD [facility] [phone]

## 2016-12-08 ENCOUNTER — Encounter: Payer: Self-pay | Admitting: Cardiothoracic Surgery

## 2016-12-08 ENCOUNTER — Ambulatory Visit (INDEPENDENT_AMBULATORY_CARE_PROVIDER_SITE_OTHER): Payer: Self-pay | Admitting: Cardiothoracic Surgery

## 2016-12-08 VITALS — BP 135/68 | HR 64 | Resp 20 | Ht 65.0 in | Wt 139.0 lb

## 2016-12-08 DIAGNOSIS — I2510 Atherosclerotic heart disease of native coronary artery without angina pectoris: Secondary | ICD-10-CM

## 2016-12-08 DIAGNOSIS — Z951 Presence of aortocoronary bypass graft: Secondary | ICD-10-CM

## 2016-12-08 DIAGNOSIS — C189 Malignant neoplasm of colon, unspecified: Secondary | ICD-10-CM

## 2016-12-08 NOTE — Progress Notes (Signed)
PCP is [name], PA-C Referring Provider is Belva Crome, MD  Chief Complaint  Patient presents with  . Routine Post Op    3 week f/u for surgical wound check     HPI: Final postop visit after multivessel CABG. Patient has diabetes on insulin pump. Other than a small superficial leg incision infection she has done well. 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 Her leg incision is now well-healed. Last hematocrit in our system 6 weeks ago was 30.5% she is having no angina or symptoms of CHF. Postop chest x-ray is clear. She is currently in sinus rhythm after resolving brief atrial fibrillation postoperatively she is ambulating 10-15 minutes daily. The patient will be strong enough for abdominal surgery and resection of a colon tumor after May 1.   Past Medical History:  Diagnosis Date  . Arthritis   . Cancer [facility])    colon cancer  . Coronary artery disease   . Diabetes mellitus without complication ([place])   . GERD (gastroesophageal reflux disease)   . Heart murmur   . History of blood transfusion   . History of kidney stones   . Hyperlipidemia   . Hypertension   . Iron deficiency anemia due to chronic blood loss 09/17/2016  . 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 PONV (postoperative nausea and vomiting)    gets nauseated easily    Past Surgical History:  Procedure Laterality Date  . ABDOMINAL HYSTERECTOMY    . APPENDECTOMY    . CARDIAC CATHETERIZATION N/A 09/30/2016   Procedure: Left Heart Cath and Coronary Angiography;  Surgeon: Belva Crome, MD;  Location: [place] [facility];  Service: Cardiovascular;  Laterality: N/A;  . CHOLECYSTECTOMY    . COLONOSCOPY    . CORONARY ARTERY BYPASS GRAFT N/A 10/11/2016   Procedure: CORONARY ARTERY BYPASS GRAFTING (CABG) times three using left internal mammary artery and right leg saphenous vein;  Surgeon: Ivin Poot, MD;  Location: [place];  Service: Open  Heart Surgery;  Laterality: N/A;  . TEE WITHOUT CARDIOVERSION N/A 10/11/2016   Procedure: TRANSESOPHAGEAL ECHOCARDIOGRAM (TEE);  Surgeon: Ivin Poot, MD;  Location: [place];  Service: Open Heart Surgery;  Laterality: N/A;    Family History  Problem Relation Age of Onset  . Heart attack Mother   . Diabetes Mother   . Heart attack Father     Social History Social History  Substance Use Topics  . Smoking status: Never Smoker  . Smokeless tobacco: Never Used  . Alcohol use No    Current Outpatient Prescriptions  Medication Sig Dispense Refill  . aspirin EC 81 MG EC tablet Take 1 tablet (81 mg total) by mouth daily.    Marland Kitchen atorvastatin (LIPITOR) 20 MG tablet TAKE 1 TABLET DAILY 90 tablet 3  . Calcium Carbonate-Vitamin D (CALTRATE 600+D PO) Take 1 tablet by mouth daily.     Marland Kitchen diltiazem (CARDIZEM SR) 60 MG 12 hr capsule Take 1 capsule (60 mg total) by mouth every 12 (twelve) hours. 60 capsule 3  . Icosapent Ethyl 1 g CAPS Take 2 g by mouth 2 (two) times daily. 120 capsule 4  . Insulin Human (INSULIN PUMP) SOLN Inject 1.5-5.5 each into the skin 3 (three) times daily. humalog    . metFORMIN (GLUCOPHAGE-XR) 500 MG 24 hr tablet 500 mg BID  5  . omeprazole (PRILOSEC) 40 MG capsule Take 40 mg by mouth 2 (two) times daily.    Marland Kitchen  valsartan (DIOVAN) 40 MG tablet Take 1 tablet (40 mg total) by mouth daily. 30 tablet 3  . Vitamin D, Cholecalciferol, 400 units CAPS Take 800 Units by mouth daily.      No current facility-administered medications for this visit.     Allergies  Allergen Reactions  . Hydrochlorothiazide     Other reaction(s): Other HYPERCALCEMIA  . Losartan Potassium     Other reaction(s): Other Increased eosinophils    Review of Systems  Getting stronger Weight is stable No shortness of breath or chest pain  BP 135/68   Pulse 64   Resp 20   Ht 5\' 5"  (1.651 m)   Wt 139 lb (63 kg)   SpO2 96% Comment: RA  BMI 23.13 kg/m  Physical Exam      Exam    General- alert  and comfortable   Lungs- clear without rales, wheezes   Cor- regular rate and rhythm, no murmur , gallop   Abdomen- soft, non-tender   Extremities - warm, non-tender, minimal edema   Neuro- oriented, appropriate, no focal weakness   Diagnostic Tests:  Last chest x-ray reviewed and is clear  Impression: Excellent recovery after multivessel CABG Right colon tumor with blood loss anemia Insulin-dependent diabetes mellitus  Plan:Patient is cleared for abdominal surgery in May  from thoracic surgical perspective. She knows after May 1 there are no limitations for activity regarding her sternotomy and CABG. She will return as needed  Len Childs, MD [facility] [phone]

## 2016-12-08 NOTE — Progress Notes (Signed)
PCP is [name], PA-C Referring Provider is Belva Crome, MD  Chief Complaint  Patient presents with  . Routine Post Op    3 week f/u for surgical wound check     HPI:   Past Medical History:  Diagnosis Date  . Arthritis   . Cancer [facility])    colon cancer  . Coronary artery disease   . Diabetes mellitus without complication ([name])   . GERD (gastroesophageal reflux disease)   . Heart murmur   . History of blood transfusion   . History of kidney stones   . Hyperlipidemia   . Hypertension   . Iron deficiency anemia due to chronic blood loss 09/17/2016  . 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 PONV (postoperative nausea and vomiting)    gets nauseated easily    Past Surgical History:  Procedure Laterality Date  . ABDOMINAL HYSTERECTOMY    . APPENDECTOMY    . CARDIAC CATHETERIZATION N/A 09/30/2016   Procedure: Left Heart Cath and Coronary Angiography;  Surgeon: Belva Crome, MD;  Location: [place] [facility];  Service: Cardiovascular;  Laterality: N/A;  . CHOLECYSTECTOMY    . COLONOSCOPY    . CORONARY ARTERY BYPASS GRAFT N/A 10/11/2016   Procedure: CORONARY ARTERY BYPASS GRAFTING (CABG) times three using left internal mammary artery and right leg saphenous vein;  Surgeon: Ivin Poot, MD;  Location: [place];  Service: Open Heart Surgery;  Laterality: N/A;  . TEE WITHOUT CARDIOVERSION N/A 10/11/2016   Procedure: TRANSESOPHAGEAL ECHOCARDIOGRAM (TEE);  Surgeon: Ivin Poot, MD;  Location: [place];  Service: Open Heart Surgery;  Laterality: N/A;    Family History  Problem Relation Age of Onset  . Heart attack Mother   . Diabetes Mother   . Heart attack Father     Social History Social History  Substance Use Topics  . Smoking status: Never Smoker  . Smokeless tobacco: Never Used  . Alcohol use No    Current Outpatient Prescriptions  Medication Sig Dispense Refill  . aspirin EC 81 MG EC tablet Take 1 tablet (81 mg total) by mouth daily.    Marland Kitchen atorvastatin (LIPITOR) 20  MG tablet TAKE 1 TABLET DAILY 90 tablet 3  . Calcium Carbonate-Vitamin D (CALTRATE 600+D PO) Take 1 tablet by mouth daily.     Marland Kitchen diltiazem (CARDIZEM SR) 60 MG 12 hr capsule Take 1 capsule (60 mg total) by mouth every 12 (twelve) hours. 60 capsule 3  . Icosapent Ethyl 1 g CAPS Take 2 g by mouth 2 (two) times daily. 120 capsule 4  . Insulin Human (INSULIN PUMP) SOLN Inject 1.5-5.5 each into the skin 3 (three) times daily. humalog    . metFORMIN (GLUCOPHAGE-XR) 500 MG 24 hr tablet 500 mg BID  5  . omeprazole (PRILOSEC) 40 MG capsule Take 40 mg by mouth 2 (two) times daily.    . valsartan (DIOVAN) 40 MG tablet Take 1 tablet (40 mg total) by mouth daily. 30 tablet 3  . Vitamin D, Cholecalciferol, 400 units CAPS Take 800 Units by mouth daily.      No current facility-administered medications for this visit.     Allergies  Allergen Reactions  . Hydrochlorothiazide     Other reaction(s): Other HYPERCALCEMIA  . Losartan Potassium     Other reaction(s): Other Increased eosinophils    Review of Systems  BP 135/68   Pulse 64   Resp 20   Ht 5\' 5"  (1.651 m)   Wt 139 lb (63 kg)  SpO2 96% Comment: RA  BMI 23.13 kg/m  Physical Exam   Diagnostic Tests:   Impression:   Plan:   Len Childs, MD [facility] [phone]

## 2016-12-28 NOTE — Progress Notes (Signed)
Cardiology Office Note   Date:  12/31/2016   ID:  [name], DOB September 14, 1945, MRN [id number]  PCP:  [name], PA-C  Cardiologist:   Jenkins Rouge, MD   Chief Complaint  Patient presents with  . Coronary Artery Disease      History of Present Illness: [name] is a 71 y.o. female who presents for post hospital f/u. Seen 09/21/16 for preop evaluation newly diagnosed cecal cancer. Long standing IDDM on insulin pump, HTN . Noted significant anemia Hct 22. Was to have right hemicolectomy with Dr Rosalio Loud Myovue done 09/24/16 reviewed and was abnormal with EF 64% with anteroseptal infarct/ischemia.  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 Seen by Dr Lovena Le EP  CHADVASC 3 but due to anemia and cecal Mass not started on anticoagulation. Echo 09/30/16 showed normal atrial sizes EF 60-65%    Past Medical History:  Diagnosis Date  . Arthritis   . Cancer [facility])    colon cancer  . Coronary artery disease   . Diabetes mellitus without complication ([place])   . GERD (gastroesophageal reflux disease)   . Heart murmur   . History of blood transfusion   . History of kidney stones   . Hyperlipidemia   . Hypertension   . Iron deficiency anemia due to chronic blood loss 09/17/2016  . 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 PONV (postoperative nausea and vomiting)    gets nauseated easily    Past Surgical History:  Procedure Laterality Date  . ABDOMINAL HYSTERECTOMY    . APPENDECTOMY    . CARDIAC CATHETERIZATION N/A 09/30/2016   Procedure: Left Heart Cath and Coronary Angiography;  Surgeon: Belva Crome, MD;  Location: [place] [facility];  Service: Cardiovascular;  Laterality: N/A;  . CHOLECYSTECTOMY    . COLONOSCOPY    . CORONARY ARTERY BYPASS GRAFT N/A 10/11/2016   Procedure:  CORONARY ARTERY BYPASS GRAFTING (CABG) times three using left internal mammary artery and right leg saphenous vein;  Surgeon: Ivin Poot, MD;  Location: [place];  Service: Open Heart Surgery;  Laterality: N/A;  . TEE WITHOUT CARDIOVERSION N/A 10/11/2016   Procedure: TRANSESOPHAGEAL ECHOCARDIOGRAM (TEE);  Surgeon: Ivin Poot, MD;  Location: [place];  Service: Open Heart Surgery;  Laterality: N/A;     Current Outpatient Prescriptions  Medication Sig Dispense Refill  . aspirin EC 81 MG EC tablet Take 1 tablet (81 mg total) by mouth daily.    Marland Kitchen atorvastatin (LIPITOR) 20 MG tablet TAKE 1 TABLET DAILY 90 tablet 3  . Calcium Carbonate-Vitamin D (CALTRATE 600+D PO) Take 1 tablet by mouth daily.     Marland Kitchen diltiazem (CARDIZEM SR) 60 MG 12 hr capsule Take 1 capsule (60 mg total) by mouth every 12 (twelve) hours. 60 capsule 3  . Insulin Human (INSULIN PUMP) SOLN Inject 1.5-5.5 each into the skin 3 (three) times daily. humalog    . metFORMIN (GLUCOPHAGE-XR) 500 MG 24 hr tablet 500 mg BID  5  . omeprazole (PRILOSEC) 40 MG capsule Take 40 mg by mouth daily.     . valsartan (DIOVAN) 40 MG tablet Take 1 tablet (40 mg total) by mouth daily. 30 tablet 3  . Vitamin D, Cholecalciferol, 400 units CAPS Take 800  Units by mouth daily.      No current facility-administered medications for this visit.     Allergies:   Hydrochlorothiazide and Losartan potassium    Social History:  The patient  reports that she has never smoked. She has never used smokeless tobacco. She reports that she does not drink alcohol or use drugs.   Family History:  The patient's family history includes Diabetes in her mother; Heart attack in her father and mother.    ROS:  Please see the history of present illness.   Otherwise, review of systems are positive for none.   All other systems are reviewed and negative.    PHYSICAL EXAM: VS:  BP (!) 120/52   Pulse 71   Ht 5\' 4"  (1.626 m)   Wt 144 lb 12.8 oz (65.7 kg)   SpO2 98%   BMI  24.85 kg/m  , BMI Body mass index is 24.85 kg/m. Affect appropriate Pale elderly female  HEENT: normal Neck supple with no adenopathy JVP normal no bruits no thyromegaly Lungs clear with no wheezing and good diaphragmatic motion Heart:  S1/S2 SEM  murmur, no rub, gallop or click sternotomy well healed PMI normal Abdomen: benighn, BS positve, no tenderness, no AAA no bruit.  No HSM or HJR Distal pulses intact with no bruits No edema Neuro non-focal Skin warm and dry post venectomy RLE  No muscular weakness    EKG:   NSR rate 67 normal 11/02/16    Recent Labs: 10/08/2016: ALT 33 10/12/2016: Magnesium 2.0 10/16/2016: Hemoglobin 9.2; Platelets 166 10/19/2016: BUN 23; Creatinine, Ser 1.31; Potassium 3.8; Sodium 137    Lipid Panel    Component Value Date/Time   CHOL 156 01/13/2016 0842   TRIG 360 (H) 01/13/2016 0842   HDL 46 01/13/2016 0842   CHOLHDL 3.4 01/13/2016 0842   VLDL 72 (H) 01/13/2016 0842   LDLCALC 38 01/13/2016 0842      Wt Readings from Last 3 Encounters:  12/31/16 144 lb 12.8 oz (65.7 kg)  12/08/16 139 lb (63 kg)  11/17/16 139 lb (63 kg)      Other studies Reviewed: Additional studies/ records that were reviewed today include: Hospital notes cath CABG and d/c summary ECG's February 2018 .    ASSESSMENT AND PLAN:  1. CAD/CABG healed very well continue asa and statin 2. PAF  maint NSR with no palpitations or pre syncope  3. Cecal Cancer check labs today including Hb/Hct f/u [place] to schedule colectomy  4. Anemia see above labs today no anticoagulation for PAF due to colon cancer  5. DM Discussed low carb diet.  Target hemoglobin A1c is 6.5 or less.  Continue current medications. 6. Murmur:  Ecoh 09/30/16 no significant valve disease nor TEE 10/11/16 suspect AV sclerosis    Current medicines are reviewed at length with the patient today.  The patient does not have concerns regarding medicines.  The following changes have been made:  no change  Labs/  tests ordered today include: CBC PLT BMET   Orders Placed This Encounter  Procedures  . CBC with Differential/Platelet  . Basic metabolic panel     Disposition:   FU with me in 3 months      Signed, Jenkins Rouge, MD  12/31/2016 2:44 PM    [facility] [address], [place], [place]  [address] Phone: [phone]; Fax: [phone]

## 2016-12-31 ENCOUNTER — Encounter: Payer: Self-pay | Admitting: Cardiovascular Disease

## 2016-12-31 ENCOUNTER — Ambulatory Visit (INDEPENDENT_AMBULATORY_CARE_PROVIDER_SITE_OTHER): Payer: Medicare Other | Admitting: Cardiovascular Disease

## 2016-12-31 VITALS — BP 120/52 | HR 71 | Ht 64.0 in | Wt 144.8 lb

## 2016-12-31 DIAGNOSIS — D649 Anemia, unspecified: Secondary | ICD-10-CM | POA: Diagnosis not present

## 2016-12-31 DIAGNOSIS — I2510 Atherosclerotic heart disease of native coronary artery without angina pectoris: Secondary | ICD-10-CM

## 2016-12-31 NOTE — Patient Instructions (Addendum)
Medication Instructions:  Your physician recommends that you continue on your current medications as directed. Please refer to the Current Medication list given to you today.  Labwork: Your physician recommends that you return for lab work today for [place], BMET  Testing/Procedures: NONE  Follow-Up: Your physician wants you to follow-up in: 3 months with Dr. Johnsie Cancel.    If you need a refill on your cardiac medications before your next appointment, please call your pharmacy.

## 2017-01-01 LAB — BASIC METABOLIC PANEL
BUN / CREAT RATIO: 21 (ref 12–28)
BUN: 23 mg/dL (ref 8–27)
CHLORIDE: 100 mmol/L (ref 96–106)
CO2: 22 mmol/L (ref 18–29)
Calcium: 9.5 mg/dL (ref 8.7–10.3)
Creatinine, Ser: 1.11 mg/dL — ABNORMAL HIGH (ref 0.57–1.00)
GFR calc non Af Amer: 50 mL/min/{1.73_m2} — ABNORMAL LOW (ref >59)
GFR, EST AFRICAN AMERICAN: 57 mL/min/{1.73_m2} — AB (ref >59)
Glucose: 102 mg/dL — ABNORMAL HIGH (ref 65–99)
POTASSIUM: 5.3 mmol/L — AB (ref 3.5–5.2)
Sodium: 139 mmol/L (ref 134–144)

## 2017-01-01 LAB — CBC WITH DIFFERENTIAL/PLATELET
BASOS: 0 %
Basophils Absolute: 0 10*3/uL (ref 0.0–0.2)
EOS (ABSOLUTE): 0.2 10*3/uL (ref 0.0–0.4)
Eos: 3 %
Hematocrit: 31.3 % — ABNORMAL LOW (ref 34.0–46.6)
Hemoglobin: 10.1 g/dL — ABNORMAL LOW (ref 11.1–15.9)
Immature Grans (Abs): 0 10*3/uL (ref 0.0–0.1)
Immature Granulocytes: 0 %
LYMPHS ABS: 1.1 10*3/uL (ref 0.7–3.1)
Lymphs: 19 %
MCH: 30.6 pg (ref 26.6–33.0)
MCHC: 32.3 g/dL (ref 31.5–35.7)
MCV: 95 fL (ref 79–97)
Monocytes Absolute: 0.4 10*3/uL (ref 0.1–0.9)
Monocytes: 8 %
NEUTROS ABS: 3.9 10*3/uL (ref 1.4–7.0)
Neutrophils: 70 %
PLATELETS: 274 10*3/uL (ref 150–379)
RBC: 3.3 x10E6/uL — ABNORMAL LOW (ref 3.77–5.28)
RDW: 13.5 % (ref 12.3–15.4)
WBC: 5.6 10*3/uL (ref 3.4–10.8)

## 2017-02-09 ENCOUNTER — Other Ambulatory Visit: Payer: Self-pay | Admitting: Cardiovascular Disease

## 2017-02-09 MED ORDER — DILTIAZEM HCL ER 60 MG PO CP12: 60.0000 mg | ORAL_CAPSULE | Freq: Two times a day (BID) | ORAL | 2 refills | Status: DC

## 2017-02-10 ENCOUNTER — Other Ambulatory Visit: Payer: Self-pay | Admitting: *Deleted

## 2017-02-10 ENCOUNTER — Telehealth: Payer: Self-pay | Admitting: Cardiovascular Disease

## 2017-02-10 MED ORDER — DILTIAZEM HCL ER 60 MG PO CP12: 60.0000 mg | ORAL_CAPSULE | Freq: Two times a day (BID) | ORAL | 2 refills | Status: DC

## 2017-02-10 NOTE — Telephone Encounter (Signed)
New message      Calling to double check diltiazem presc.  Pt has been on regular release but a presc was sent in for extended release.  Please call

## 2017-02-10 NOTE — Telephone Encounter (Signed)
PT HAS ALWAYS BEEN ON EXTENDED RELEASE, WILL INFORM PHARMACY OF THIS, SPOKE WITH DAWN & CONFIRMED WITH HER THAT THE PT HAS ALWAYS BEEN ON ER DILT. SHE EXPRESSED UNDERSTANDING.

## 2017-03-14 ENCOUNTER — Telehealth: Payer: Self-pay | Admitting: Cardiovascular Disease

## 2017-03-14 NOTE — Telephone Encounter (Signed)
New message    Sonia Baller from [place] is calling for clarification on pt diltiazem. She states that before this medication was prescribed from another provider but it was immediate release. She said she wanted to make sure that this prescription was for extended release.

## 2017-03-14 NOTE — Telephone Encounter (Signed)
Called Sonia Baller from [place], informed her that patient should be taking diltiazem extended release 60 mg by mouth twice daily. Sonia Baller verbalized understanding. Called patient to make sure she has been taking the extended release. Patient stated she has been taking extended release, and she is in the process of changing pharmacies.

## 2017-03-16 ENCOUNTER — Telehealth: Payer: Self-pay | Admitting: Cardiovascular Disease

## 2017-03-16 NOTE — Telephone Encounter (Signed)
Patient wants to know if she can switch from taking cardizem SR, 12 hr extended release capsule, to Cardizem tablets. Patient stated if the extended release is what she needs, then she will stay on them, but the cost difference is about $30. Patient stated she recently changed pharmacies and her old pharmacy was giving her tablets, not extended release. Will forward to Dr. Johnsie Cancel for advisement.

## 2017-03-16 NOTE — Telephone Encounter (Signed)
New message    Pt is calling about her diltiazem. She said since she changed pharmacy, it was changed to a capsule. The price difference is a lot and pt is wondering if she can go back to taking a tablet. Please call.

## 2017-03-17 MED ORDER — DILTIAZEM HCL 60 MG PO TABS: 60.0000 mg | ORAL_TABLET | Freq: Two times a day (BID) | ORAL | Status: DC

## 2017-03-17 NOTE — Telephone Encounter (Signed)
Called patient and let her know that Dr. Johnsie Cancel is okay with her switching to short acting tablets. Encouraged patient to take what she has now. Informed patient to give our office a call when she is almost out of the extended release, and our office will send in her new refill for the short acting tablets. Patient verbalized understanding.

## 2017-03-17 NOTE — Telephone Encounter (Signed)
Ok to switch to short acting tablets

## 2017-04-06 ENCOUNTER — Ambulatory Visit: Payer: Medicare Other | Admitting: Cardiovascular Disease

## 2017-04-07 LAB — HM MAMMOGRAPHY

## 2017-04-08 ENCOUNTER — Encounter: Payer: Self-pay | Admitting: Physician Assistant

## 2017-04-08 ENCOUNTER — Telehealth: Payer: Self-pay | Admitting: Pharmacist

## 2017-04-08 NOTE — Telephone Encounter (Addendum)
Pt left a message on the voicemail stating she was affected by valsartan recall. Will switch valsartan 40mg  daily to irbesartan 37.5mg  daily - 1/2 tab of the 75mg  daily. Pt has allergy listed to losartan so unable to make a direct conversion to another ARB with one tablet daily dosing. LMOM for pt to return call.

## 2017-04-13 MED ORDER — IRBESARTAN 75 MG PO TABS: 37.5000 mg | ORAL_TABLET | Freq: Every day | ORAL | 11 refills | Status: DC

## 2017-04-13 NOTE — Telephone Encounter (Signed)
Spoke with pt and relayed message regarding switch to irbesartan 37.5mg  daily, rx sent to pharmacy.

## 2017-06-28 NOTE — Progress Notes (Addendum)
Cardiology Office Note   Date:  07/01/2017   ID:  [name], DOB [birth year], MRN [id number]  PCP:  Donella Stade, PA-C  Cardiologist:   Jenkins Rouge, MD   No chief complaint on file.     History of Present Illness: [name] is a 71 y.o. female who presents for post hospital f/u. Seen 09/21/16 for preop evaluation newly diagnosed cecal cancer. Long standing IDDM on insulin pump, HTN . Noted significant anemia Hct 22. Was to have right hemicolectomy with Dr Rosalio Loud Myovue done 09/24/16 reviewed and was abnormal with EF 64% with anteroseptal infarct/ischemia.  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 Seen by Dr Lovena Le EP  CHADVASC 3 but due to anemia and cecal Cancer not started on anticoagulation. Echo 09/30/16 showed normal atrial sizes EF 60-65%   She is seeing [facility]  She had ileocolectomy May 2018  She has had chemo for two liver lesions with good response. Surgical planning November laparoscopy With possible partial hepatectomy and microwave ablation of liver mets  No cardiac issues Hb over ten now   Past Medical History:  Diagnosis Date  . Arthritis   . Cancer [facility])    colon cancer  . Coronary artery disease   . Diabetes mellitus without complication ([place])   . GERD (gastroesophageal reflux disease)   . Heart murmur   . History of blood transfusion   . History of kidney stones   . Hyperlipidemia   . Hypertension   . Iron deficiency anemia due to chronic blood loss 09/17/2016  . 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 PONV (postoperative nausea and vomiting)    gets nauseated easily    Past Surgical History:  Procedure Laterality Date  . ABDOMINAL HYSTERECTOMY    . APPENDECTOMY    . CARDIAC CATHETERIZATION N/A 09/30/2016   Procedure: Left Heart Cath and  Coronary Angiography;  Surgeon: Belva Crome, MD;  Location: [place] [facility];  Service: Cardiovascular;  Laterality: N/A;  . CHOLECYSTECTOMY    . COLONOSCOPY    . CORONARY ARTERY BYPASS GRAFT N/A 10/11/2016   Procedure: CORONARY ARTERY BYPASS GRAFTING (CABG) times three using left internal mammary artery and right leg saphenous vein;  Surgeon: Ivin Poot, MD;  Location: [place];  Service: Open Heart Surgery;  Laterality: N/A;  . TEE WITHOUT CARDIOVERSION N/A 10/11/2016   Procedure: TRANSESOPHAGEAL ECHOCARDIOGRAM (TEE);  Surgeon: Ivin Poot, MD;  Location: [place];  Service: Open Heart Surgery;  Laterality: N/A;     Current Outpatient Prescriptions  Medication Sig Dispense Refill  . acetaminophen (TYLENOL) 650 MG CR tablet Take 650 mg by mouth as needed.    Marland Kitchen aspirin EC 81 MG EC tablet Take 1 tablet (81 mg total) by mouth daily.    Marland Kitchen atorvastatin (LIPITOR) 20 MG tablet TAKE 1 TABLET DAILY 90 tablet 3  . Calcium Carbonate-Vitamin D (CALTRATE 600+D PO) Take 1 tablet by mouth daily.     Marland Kitchen diltiazem (CARDIZEM) 60 MG tablet Take 1 tablet (60 mg total) by mouth 2 (two) times daily.    . Insulin Human (INSULIN PUMP) SOLN Inject 1.5-5.5 each into the skin 3 (three) times daily. humalog    . irbesartan (AVAPRO) 75 MG tablet  Take 0.5 tablets (37.5 mg total) by mouth daily. 15 tablet 11  . lidocaine-prilocaine (EMLA) cream Apply topically as needed.    . loperamide (IMODIUM) 2 MG capsule Take 2 mg by mouth as needed.    . metFORMIN (GLUCOPHAGE-XR) 500 MG 24 hr tablet 500 mg BID  5  . omeprazole (PRILOSEC) 40 MG capsule Take 40 mg by mouth daily.     . Vitamin D, Cholecalciferol, 400 units CAPS Take 800 Units by mouth daily.      No current facility-administered medications for this visit.     Allergies:   Hydrochlorothiazide and Losartan potassium    Social History:  The patient  reports that she has never smoked. She has never used smokeless tobacco. She reports that she does not drink  alcohol or use drugs.   Family History:  The patient's family history includes Diabetes in her mother; Heart attack in her father and mother.    ROS:  Please see the history of present illness.   Otherwise, review of systems are positive for none.   All other systems are reviewed and negative.    PHYSICAL EXAM: VS:  BP (!) 168/78   Pulse 75   Ht 5\' 4"  (1.626 m)   Wt 153 lb (69.4 kg)   SpO2 98%   BMI 26.26 kg/m  , BMI Body mass index is 26.26 kg/m. Affect appropriate Healthy:  appears stated age 34: normal Neck supple with no adenopathy port a cath on right  JVP normal left  bruits no thyromegaly Lungs clear with no wheezing and good diaphragmatic motion Heart:  S1/S2 SEM  murmur, no rub, gallop or click sternum well healed  PMI normal Abdomen: benighn post hysterectomy and colectomy scars  no bruit.  No HSM or HJR Distal pulses intact with no bruits No edema Neuro non-focal Skin warm and dry No muscular weakness Post right venectomy from CABG      EKG:   NSR rate 67 normal 11/02/16    Recent Labs: 10/08/2016: ALT 33 10/12/2016: Magnesium 2.0 12/31/2016: BUN 23; Creatinine, Ser 1.11; Hemoglobin 10.1; Platelets 274; Potassium 5.3; Sodium 139    Lipid Panel    Component Value Date/Time   CHOL 156 01/13/2016 0842   TRIG 360 (H) 01/13/2016 0842   HDL 46 01/13/2016 0842   CHOLHDL 3.4 01/13/2016 0842   VLDL 72 (H) 01/13/2016 0842   LDLCALC 38 01/13/2016 0842      Wt Readings from Last 3 Encounters:  07/01/17 153 lb (69.4 kg)  12/31/16 144 lb 12.8 oz (65.7 kg)  12/08/16 139 lb (63 kg)      Other studies Reviewed: Additional studies/ records that were reviewed today include: Hospital notes cath CABG and d/c summary ECG's February 2018 .    ASSESSMENT AND PLAN:  1. CAD/CABG stable no angina continue medical Rx 2. PAF  maint NSR with no palpitations or pre syncope  3. Cecal Cancer : Ileocolectomy May 2018 Chemo for liver lesions Subsequent surgery planned  November 2018  4. Anemia see above labs today no anticoagulation for PAF due to colon cancer  5. DM Discussed low carb diet.  Target hemoglobin A1c is 6.5 or less.  Continue current medications. 6. Murmur:  Ecoh 09/30/16 no significant valve disease nor TEE 10/11/16 suspect AV sclerosis  7. Bruit: left 40-59% RICA duplex January 2019   Current medicines are reviewed at length with the patient today.  The patient does not have concerns regarding medicines.  The following changes have  been made:  no change  Labs/ tests ordered today include:   No orders of the defined types were placed in this encounter.    Disposition:   FU with me in 6 months      Signed, Jenkins Rouge, MD  07/01/2017 11:14 AM    [facility] [place], [place], [place]  [address] Phone: [phone]; Fax: [phone]

## 2017-06-29 ENCOUNTER — Other Ambulatory Visit: Payer: Self-pay | Admitting: *Deleted

## 2017-06-29 DIAGNOSIS — I6529 Occlusion and stenosis of unspecified carotid artery: Secondary | ICD-10-CM

## 2017-07-01 ENCOUNTER — Encounter: Payer: Self-pay | Admitting: Cardiovascular Disease

## 2017-07-01 ENCOUNTER — Ambulatory Visit (INDEPENDENT_AMBULATORY_CARE_PROVIDER_SITE_OTHER): Payer: Medicare Other | Admitting: Cardiovascular Disease

## 2017-07-01 VITALS — BP 168/78 | HR 75 | Ht 64.0 in | Wt 153.0 lb

## 2017-07-01 DIAGNOSIS — Z951 Presence of aortocoronary bypass graft: Secondary | ICD-10-CM

## 2017-07-01 NOTE — Patient Instructions (Signed)

## 2017-07-13 ENCOUNTER — Telehealth: Payer: Self-pay | Admitting: Cardiovascular Disease

## 2017-07-13 NOTE — Telephone Encounter (Signed)
Informed patient that she is not suppose to be on extended release, and she should check with her pharmacy about what they have given her. Informed patient that she is to be taking Diltiazem 60 mg by mouth twice daily. Patient stated her insurance company stated she was on extended release. Patient also stated that on her medication bottle it states Diltiazem ER. Informed patient to talk to her pharmacy about getting her the non-extended release filled, because that is what was ordered. Patient agreed to plan and will talk to her pharmacy.

## 2017-07-13 NOTE — Telephone Encounter (Signed)
New message  Patient is taking the extended release tablet. Wants to switch to non extended release due to the cost.  Pt c/o medication issue:  1. Name of Medication: diltiazem (CARDIZEM) 60 MG tablet  2. How are you currently taking this medication (dosage and times per day)? Take 1 tablet (60 mg total) by mouth 2 (two) times daily.  3. Are you having a reaction (difficulty breathing--STAT)? NO  4. What is your medication issue? Patient wants to know if she can take the tablet that is NOT extended  release because it is cheaper.

## 2017-07-24 MED ORDER — GENERIC EXTERNAL MEDICATION
Status: DC
Start: ? — End: 2017-07-24

## 2017-07-24 MED ORDER — SODIUM CHLORIDE 0.9 % IV SOLN
INTRAVENOUS | Status: DC
Start: ? — End: 2017-07-24

## 2017-07-24 MED ORDER — OXYCODONE HCL 5 MG PO TABS
5.00 | ORAL_TABLET | ORAL | Status: DC
Start: ? — End: 2017-07-24

## 2017-07-24 MED ORDER — HYDROMORPHONE HCL 1 MG/ML IJ SOLN
0.50 | INTRAMUSCULAR | Status: DC
Start: ? — End: 2017-07-24

## 2017-07-24 MED ORDER — HYDROMORPHONE HCL 1 MG/ML IJ SOLN
0.20 | INTRAMUSCULAR | Status: DC
Start: ? — End: 2017-07-24

## 2017-07-24 MED ORDER — DILTIAZEM HCL ER 60 MG PO CP12
60.00 | ORAL_CAPSULE | ORAL | Status: DC
Start: 2017-07-22 — End: 2017-07-24

## 2017-07-24 MED ORDER — DIPHENHYDRAMINE HCL 50 MG/ML IJ SOLN
12.50 | INTRAMUSCULAR | Status: DC
Start: ? — End: 2017-07-24

## 2017-07-24 MED ORDER — OXYCODONE HCL 10 MG PO TABS
10.00 | ORAL_TABLET | ORAL | Status: DC
Start: ? — End: 2017-07-24

## 2017-07-24 MED ORDER — ENOXAPARIN SODIUM 40 MG/0.4ML ~~LOC~~ SOLN
40.00 | SUBCUTANEOUS | Status: DC
Start: 2017-07-23 — End: 2017-07-24

## 2017-07-24 MED ORDER — PANTOPRAZOLE SODIUM 40 MG PO TBEC
40.00 | DELAYED_RELEASE_TABLET | ORAL | Status: DC
Start: 2017-07-23 — End: 2017-07-24

## 2017-09-08 ENCOUNTER — Telehealth: Payer: Self-pay

## 2017-09-08 MED ORDER — DILTIAZEM HCL ER COATED BEADS 120 MG PO CP24: 120.0000 mg | ORAL_CAPSULE | Freq: Every day | ORAL | 3 refills | Status: DC

## 2017-09-08 NOTE — Telephone Encounter (Signed)
Adrian sent in request can this medication be change. 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 Will forward to Dr. Johnsie Cancel for advisement.

## 2017-09-08 NOTE — Telephone Encounter (Signed)
S[place], they stated they could get the diltiazem 120 mg. Sent in order for Diltiazem 120 mg daily. Called patient and informed her of change. Patient verbalized understanding.

## 2017-09-08 NOTE — Telephone Encounter (Signed)
See if they have cardizem CD 120 mg daily

## 2017-09-12 ENCOUNTER — Encounter: Payer: Self-pay | Admitting: Physician Assistant

## 2017-09-12 ENCOUNTER — Ambulatory Visit (INDEPENDENT_AMBULATORY_CARE_PROVIDER_SITE_OTHER): Payer: Medicare Other | Admitting: Physician Assistant

## 2017-09-12 VITALS — BP 165/59 | HR 78 | Ht 64.0 in | Wt 157.0 lb

## 2017-09-12 DIAGNOSIS — E785 Hyperlipidemia, unspecified: Secondary | ICD-10-CM

## 2017-09-12 DIAGNOSIS — M79662 Pain in left lower leg: Secondary | ICD-10-CM | POA: Diagnosis not present

## 2017-09-12 NOTE — Patient Instructions (Signed)
Intermittent Claudication Intermittent claudication is pain in your leg that occurs when you walk or exercise and goes away when you rest. The pain can occur in one or both legs. What are the causes? Intermittent claudication is caused by the buildup of plaque within the major arteries in the body (atherosclerosis). The plaque, which makes arteries stiff and narrow, prevents enough blood from reaching your leg muscles. The pain occurs when you walk or exercise because your muscles need more blood when you are moving and exercising. What increases the risk? Risk factors include:  A family history of atherosclerosis.  A personal history of stroke or heart disease.  Older age.  Being inactive or overweight.  Smoking cigarettes.  Having another health condition such as: ? Diabetes. ? High blood pressure. ? High cholesterol.  What are the signs or symptoms? Your hip or leg may:  Ache.  Cramp.  Feel tight.  Feel weak.  Feel heavy.  Over time, you may feel pain in your calf, thigh, or hip. How is this diagnosed? Your health care provider may diagnose intermittent claudication based on your symptoms and medical history. Your health care provider may also do tests to learn more about your condition. These may include:  Blood tests.  An ultrasound.  Imaging tests such as angiography, magnetic resonance angiography (MRA), and computed tomography angiography (CTA).  How is this treated? You may be treated for problems such as:  High blood pressure.  High cholesterol.  Diabetes.  Other treatments may include:  Lifestyle changes such as: ? Starting an exercise program. ? Losing weight. ? Quitting smoking.  Medicines to help restore blood flow through your legs.  Blood vessel surgery (angioplasty) to restore blood flow if your intermittent claudication is caused by severe peripheral artery disease.  Follow these instructions at home:  Manage any other health  conditions you have.  Eat a diet low in saturated fats and calories to maintain a healthy weight.  Quit smoking, if you smoke.  Take medicines only as directed by your health care provider.  If your health care provider recommended an exercise program for you, follow it as directed. Your exercise program may involve: ? Walking three or more times a week. ? Walking until you have certain symptoms of intermittent claudication. ? Resting until symptoms go away. ? Gradually increasing walking time to about 50 minutes a day. Contact a health care provider if: Your condition is not getting better or is getting worse. Get help right away if:  You have chest pain.  You have difficulty breathing.  You develop arm weakness.  You have trouble speaking.  Your face begins to droop. This information is not intended to replace advice given to you by your health care provider. Make sure you discuss any questions you have with your health care provider. Document Released: 06/25/2004 Document Revised: 01/29/2016 Document Reviewed: 11/29/2013 Elsevier Interactive Patient Education  2017 Elsevier Inc.  

## 2017-09-12 NOTE — Progress Notes (Signed)
Subjective:    Patient ID: [name], female    DOB: July 20, 1946, 72 y.o.   MRN: [id number]  HPI Pt is a 72 yo female with CAD, CABG x3, Atrial fibrillation, colon cancer and Type 2 DM who presents to the clinic with left calf pain for at least one year. Pain is worse only with exertion. She cannot shop for groceries or even walk in and out of buildings without extreme pain. 2 weeks ago she did have venous ultrasound which was negative for DVT. There is no pain at rest only with exertion. No known injury. No open sores or wounds.   .. Active Ambulatory Problems    Diagnosis Date Noted  . Type 2 diabetes mellitus with complication, with long-term current use of insulin ([place]) 01/14/2016  . Mixed hyperlipidemia 01/14/2016  . Muscle cramps 01/14/2016  . Other osteoarthritis involving multiple joints 01/14/2016  . Hypertriglyceridemia 01/15/2016  . CKD (chronic kidney disease), stage III ([place]) 01/15/2016  . Chronic liver disease 02/13/2016  . Constipation 05/18/2016  . Gastric ulcer 08/27/2016  . Malignant neoplasm of ascending colon ([place]) 09/15/2016  . Gastroesophageal reflux disease without esophagitis 09/15/2016  . Low hemoglobin 09/16/2016  . Iron deficiency anemia due to chronic blood loss 09/17/2016  . 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 Coronary artery disease involving native coronary artery of native heart without angina pectoris   . S/P CABG x 3 10/11/2016  . Paroxysmal atrial fibrillation ([place]) 10/15/2016   Resolved Ambulatory Problems    Diagnosis Date Noted  . No Resolved Ambulatory Problems   Past Medical History:  Diagnosis Date  . Arthritis   . Cancer ([place])   . Coronary artery disease   . Diabetes mellitus without complication ([place])   . GERD (gastroesophageal reflux disease)   . Heart murmur   . History of blood transfusion   . History of kidney stones   . Hyperlipidemia   . Hypertension   . Iron deficiency anemia due to chronic blood loss 09/17/2016  . Iron  malabsorption 09/17/2016  . PONV (postoperative nausea and vomiting)       Review of Systems See HPI.     Objective:   Physical Exam  Constitutional: She is oriented to person, place, and time. She appears well-developed and well-nourished.  HENT:  Head: Normocephalic and atraumatic.  Cardiovascular: Normal rate, regular rhythm and normal heart sounds.  Pulmonary/Chest: Effort normal and breath sounds normal.  Musculoskeletal:  Left calf: No bruising, swelling, redness, warmth.  Tenderness to palpation over left posterior calf.  Pedal pulses 2+ and posterior tibial pulse 2+.   Neurological: She is alert and oriented to person, place, and time.  Psychiatric: She has a normal mood and affect. Her behavior is normal.          Assessment & Plan:  Marland KitchenMarland KitchenArdith was seen today for left calf pain.  Diagnoses and all orders for this visit:  Pain of left calf -     CK -     COMPLETE METABOLIC PANEL WITH GFR -     VAS [place] ABI WITH/WO TBI; Future -     VAS [place] ABI WITH/WO TBI  Hx of CABG -     Lipid Panel w/reflex Direct LDL -     VAS [place] ABI WITH/WO TBI; Future -     VAS [place] ABI WITH/WO TBI  Dyslipidemia -     Lipid Panel w/reflex Direct LDL -     VAS [place] ABI WITH/WO TBI; Future -  VAS [place] ABI WITH/WO TBI  I haven't seen an up to date lipid. Ordered today.  Pt is on statin. Will check CK. This has been going on for a while do not suspect due to statin.  cmp to check electrolytes.  Symptoms are consistent with PVD and claudication however her pedal pulse was great.  Will do ABI for confirmation.  Tylenol/Ibuprofen for pain at this time. Consider warm compresses.  Follow up as needed call with any worsening symptoms.

## 2017-09-13 ENCOUNTER — Other Ambulatory Visit: Payer: Self-pay | Admitting: Physician Assistant

## 2017-09-13 LAB — LIPID PANEL W/REFLEX DIRECT LDL
CHOLESTEROL: 147 mg/dL (ref <200)
HDL: 56 mg/dL (ref >50)
LDL Cholesterol (Calc): 63 mg/dL (calc)
Non-HDL Cholesterol (Calc): 91 mg/dL (calc) (ref <130)
Total CHOL/HDL Ratio: 2.6 (calc) (ref <5.0)
Triglycerides: 212 mg/dL — ABNORMAL HIGH (ref <150)

## 2017-09-13 LAB — COMPLETE METABOLIC PANEL WITH GFR
AG RATIO: 1.4 (calc) (ref 1.0–2.5)
ALBUMIN MSPROF: 4.3 g/dL (ref 3.6–5.1)
ALT: 49 U/L — AB (ref 6–29)
AST: 43 U/L — ABNORMAL HIGH (ref 10–35)
Alkaline phosphatase (APISO): 342 U/L — ABNORMAL HIGH (ref 33–130)
BILIRUBIN TOTAL: 0.5 mg/dL (ref 0.2–1.2)
BUN / CREAT RATIO: 23 (calc) — AB (ref 6–22)
BUN: 23 mg/dL (ref 7–25)
CHLORIDE: 104 mmol/L (ref 98–110)
CO2: 26 mmol/L (ref 20–32)
Calcium: 10.1 mg/dL (ref 8.6–10.4)
Creat: 1 mg/dL — ABNORMAL HIGH (ref 0.60–0.93)
GFR, EST AFRICAN AMERICAN: 65 mL/min/{1.73_m2} (ref >=60)
GFR, Est Non African American: 56 mL/min/{1.73_m2} — ABNORMAL LOW (ref >=60)
GLUCOSE: 145 mg/dL — AB (ref 65–99)
Globulin: 3 g/dL (calc) (ref 1.9–3.7)
POTASSIUM: 5.4 mmol/L — AB (ref 3.5–5.3)
Sodium: 139 mmol/L (ref 135–146)
TOTAL PROTEIN: 7.3 g/dL (ref 6.1–8.1)

## 2017-09-13 LAB — CK: CK TOTAL: 28 U/L — AB (ref 29–143)

## 2017-09-13 MED ORDER — TRAMADOL HCL 50 MG PO TABS: 50.0000 mg | ORAL_TABLET | Freq: Three times a day (TID) | ORAL | 0 refills | Status: DC | PRN

## 2017-09-13 NOTE — Progress Notes (Signed)
Pain requested something for leg pain. I will send over tramadol.

## 2017-09-13 NOTE — Progress Notes (Signed)
It has nothing to do with her leg pain. She should be called with ABI's to look for PVD as cause for her leg pain.

## 2017-09-13 NOTE — Progress Notes (Signed)
Fish oil will help with her high TG's/

## 2017-09-29 ENCOUNTER — Ambulatory Visit (HOSPITAL_COMMUNITY)
Admission: RE | Admit: 2017-09-29 | Discharge: 2017-09-29 | Disposition: A | Discharge status: Home / Self Care | Payer: Medicare Other | Source: Ambulatory Visit | Attending: Cardiovascular Disease | Admitting: Cardiovascular Disease

## 2017-09-29 DIAGNOSIS — M79662 Pain in left lower leg: Secondary | ICD-10-CM | POA: Insufficient documentation

## 2017-09-29 DIAGNOSIS — E785 Hyperlipidemia, unspecified: Secondary | ICD-10-CM | POA: Diagnosis present

## 2017-10-04 ENCOUNTER — Other Ambulatory Visit: Payer: Self-pay | Admitting: Physician Assistant

## 2017-10-04 DIAGNOSIS — R6889 Other general symptoms and signs: Secondary | ICD-10-CM

## 2017-10-04 NOTE — Progress Notes (Signed)
Right leg shows some non-compressibility which could be result of some calcification in venous systems. Suggest vascular referral.   Left leg where pain is was completely normal. No cause for pain.   How is left leg/calf pain currently.

## 2017-10-04 NOTE — Progress Notes (Signed)
amb  

## 2017-10-05 ENCOUNTER — Ambulatory Visit (HOSPITAL_COMMUNITY)
Admission: RE | Admit: 2017-10-05 | Discharge: 2017-10-05 | Disposition: A | Discharge status: Home / Self Care | Payer: Medicare Other | Source: Ambulatory Visit | Attending: Cardiovascular Disease | Admitting: Cardiovascular Disease

## 2017-10-05 DIAGNOSIS — I6529 Occlusion and stenosis of unspecified carotid artery: Secondary | ICD-10-CM

## 2017-10-05 DIAGNOSIS — I6523 Occlusion and stenosis of bilateral carotid arteries: Secondary | ICD-10-CM | POA: Insufficient documentation

## 2017-10-10 ENCOUNTER — Other Ambulatory Visit: Payer: Self-pay | Admitting: Physician Assistant

## 2017-10-10 ENCOUNTER — Telehealth: Payer: Self-pay | Admitting: *Deleted

## 2017-10-10 MED ORDER — TRAMADOL HCL 50 MG PO TABS: 50.0000 mg | ORAL_TABLET | Freq: Three times a day (TID) | ORAL | 0 refills | Status: DC | PRN

## 2017-10-10 NOTE — Telephone Encounter (Signed)
Pt left vm asking for a refill of the Tramadol you've given her for her leg pain. She stated that she has appointment with vascular on 2/13.

## 2017-10-10 NOTE — Telephone Encounter (Signed)
I sent refill 

## 2017-10-26 ENCOUNTER — Telehealth: Payer: Self-pay

## 2017-10-26 DIAGNOSIS — I6523 Occlusion and stenosis of bilateral carotid arteries: Secondary | ICD-10-CM

## 2017-10-26 NOTE — Telephone Encounter (Signed)
-----   Message from Josue Hector, MD sent at 10/06/2017  7:59 AM EST ----- 40-59% RICA stenosis.  F/U duplex in 1 year.

## 2017-10-26 NOTE — Telephone Encounter (Signed)
Order placed for f/u of carotid duplex in one year.

## 2017-11-23 ENCOUNTER — Encounter: Payer: Medicare Other | Admitting: Vascular Surgery

## 2017-11-23 ENCOUNTER — Encounter (HOSPITAL_COMMUNITY): Payer: Medicare Other

## 2017-12-05 ENCOUNTER — Telehealth: Payer: Self-pay | Admitting: Cardiovascular Disease

## 2017-12-05 MED ORDER — LISINOPRIL 10 MG PO TABS: 10.0000 mg | ORAL_TABLET | Freq: Every day | ORAL | 3 refills | Status: DC

## 2017-12-05 NOTE — Telephone Encounter (Signed)
Irbesartan 75 mg is on recall. Will see if there is an alternative medication Dr. Johnsie Cancel would like patient to try.

## 2017-12-05 NOTE — Telephone Encounter (Signed)
Can call in lisinopril 10 mg daily

## 2017-12-05 NOTE — Telephone Encounter (Signed)
New Message    Pt c/o medication issue:  1. Name of Medication: irbesartan (AVAPRO) 75 MG tablet    2. How are you currently taking this medication (dosage and times per day)? Once day   3. Are you having a reaction (difficulty breathing--STAT)?   4. What is your medication issue? Patient states that the pharmacy advised her that her rx was on recall. Therefore she would need a different prescription. Please call to discuss.

## 2017-12-05 NOTE — Telephone Encounter (Signed)
Called patient with change due to medication recall on irbesartan. Patient stated she has taken lisinopril before and she has no issues with this medication. Sent prescription into patient's pharmacy. Patient verbalized understanding.

## 2017-12-08 ENCOUNTER — Ambulatory Visit: Payer: Medicare Other | Admitting: Physician Assistant

## 2017-12-13 ENCOUNTER — Encounter: Payer: Self-pay | Admitting: Cardiovascular Disease

## 2018-01-04 ENCOUNTER — Ambulatory Visit: Payer: Medicare Other | Admitting: Cardiovascular Disease

## 2018-01-05 NOTE — Progress Notes (Signed)
Cardiology Office Note   Date:  01/06/2018   ID:  [name], DOB 1946-01-09, MRN [id number]  PCP:  Donella Stade, PA-C  Cardiologist:   Jenkins Rouge, MD   No chief complaint on file.     History of Present Illness:  72 y.o. first seen 09/21/16 preop. Needed colectomy for colon cancer. However do to IDDM had abnormal myovue with subsequent cath showed distal LM and 3VD Subsequently had CABG   10/11/16 LIMA LAD SVG D1 SVG OM1  Post op course with PAF and post termination pauses. Seen by Dr Lovena Le EP  CHADVASC 3 but due to anemia and cecal Cancer not started on anticoagulation. Echo 09/30/16 showed normal atrial sizes EF 60-65%   She is seeing [facility]  She had ileocolectomy May 2018  She has had chemo for two liver lesions with good response. Surgical planning November laparoscopy With possible partial hepatectomy and microwave ablation of liver mets  No cardiac issues Hb over ten now   Past Medical History:  Diagnosis Date  . Arthritis   . Cancer [facility])    colon cancer  . Coronary artery disease   . Diabetes mellitus without complication ([place])   . GERD (gastroesophageal reflux disease)   . Heart murmur   . History of blood transfusion   . History of kidney stones   . Hyperlipidemia   . Hypertension   . Iron deficiency anemia due to chronic blood loss 09/17/2016  . 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 PONV (postoperative nausea and vomiting)    gets nauseated easily    Past Surgical History:  Procedure Laterality Date  . ABDOMINAL HYSTERECTOMY    . APPENDECTOMY    . CARDIAC CATHETERIZATION N/A 09/30/2016   Procedure: Left Heart Cath and Coronary Angiography;  Surgeon: Belva Crome, MD;  Location: [place] [facility];  Service: Cardiovascular;  Laterality: N/A;  . CHOLECYSTECTOMY    . COLONOSCOPY    . CORONARY ARTERY BYPASS GRAFT N/A 10/11/2016   Procedure: CORONARY ARTERY BYPASS GRAFTING (CABG) times three using left internal mammary artery and right leg saphenous  vein;  Surgeon: Ivin Poot, MD;  Location: [place];  Service: Open Heart Surgery;  Laterality: N/A;  . TEE WITHOUT CARDIOVERSION N/A 10/11/2016   Procedure: TRANSESOPHAGEAL ECHOCARDIOGRAM (TEE);  Surgeon: Ivin Poot, MD;  Location: [place];  Service: Open Heart Surgery;  Laterality: N/A;     Current Outpatient Medications  Medication Sig Dispense Refill  . acetaminophen (TYLENOL) 650 MG CR tablet Take 650 mg by mouth as needed.    Marland Kitchen aspirin EC 81 MG EC tablet Take 1 tablet (81 mg total) by mouth daily.    Marland Kitchen atorvastatin (LIPITOR) 20 MG tablet TAKE 1 TABLET DAILY 90 tablet 3  . Calcium Carbonate-Vitamin D (CALTRATE 600+D PO) Take 1 tablet by mouth daily.     Marland Kitchen diltiazem (CARDIZEM CD) 120 MG 24 hr capsule Take 1 capsule (120 mg total) by mouth daily. 90 capsule 3  . Insulin Human (INSULIN PUMP) SOLN Inject 1.5-5.5 each into the skin 3 (three) times daily. humalog    . lisinopril (PRINIVIL,ZESTRIL) 10 MG tablet Take 1 tablet (10 mg total) by mouth daily. 90 tablet 3  . metFORMIN (GLUCOPHAGE-XR) 500 MG 24 hr tablet 500 mg BID  5  . Vitamin D, Cholecalciferol, 400 units CAPS Take 800 Units by mouth daily.      No current facility-administered medications for this visit.     Allergies:   Hydrochlorothiazide and  Losartan potassium    Social History:  The patient  reports that she has never smoked. She has never used smokeless tobacco. She reports that she does not drink alcohol or use drugs.   Family History:  The patient's family history includes Diabetes in her mother; Heart attack in her father and mother.    ROS:  Please see the history of present illness.   Otherwise, review of systems are positive for none.   All other systems are reviewed and negative.    PHYSICAL EXAM: VS:  BP (!) 196/80   Pulse 62   Ht 5\' 4"  (1.626 m)   Wt 160 lb (72.6 kg)   SpO2 97%   BMI 27.46 kg/m  , BMI Body mass index is 27.46 kg/m. Affect appropriate Healthy:  appears stated age 72:  normal Neck supple with no adenopathy JVP normal left  bruits no thyromegaly Lungs clear with no wheezing and good diaphragmatic motion Heart:  S1/S2 AS  murmur, no rub, gallop or click PMI normal post sternotomy  Abdomen: post colectomy with insulin pump no bruit.  No HSM or HJR Distal pulses intact with no bruits No edema Neuro non-focal Skin warm and dry No muscular weakness   EKG:   NSR rate 68 normal 01/06/18   Recent Labs: 09/12/2017: ALT 49; BUN 23; Creat 1.00; Potassium 5.4; Sodium 139    Lipid Panel    Component Value Date/Time   CHOL 147 09/12/2017 1152   TRIG 212 (H) 09/12/2017 1152   HDL 56 09/12/2017 1152   CHOLHDL 2.6 09/12/2017 1152   VLDL 72 (H) 01/13/2016 0842   LDLCALC 63 09/12/2017 1152      Wt Readings from Last 3 Encounters:  01/06/18 160 lb (72.6 kg)  09/12/17 157 lb (71.2 kg)  07/01/17 153 lb (69.4 kg)      Other studies Reviewed: Additional studies/ records that were reviewed today include: Hospital notes cath CABG and d/c summary ECG's February 2018 .    ASSESSMENT AND PLAN:  1. CAD/CABG stable no angina continue medical Rx 2. 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 Anemia see above labs today no anticoagulation for PAF due to colon cancer  5. DM Discussed low carb diet.  Target hemoglobin A1c is 6.5 or less.  Continue current medications. 6. Murmur:  Ecoh 09/30/16 no significant valve disease nor TEE 10/11/16 suspect AV sclerosis f/u echo 6 months 7. Bruit: left 40-59% RICA duplex January 2019 f/u 6 months ordered   Current medicines are reviewed at length with the patient today.  The patient does not have concerns regarding medicines.  The following changes have been made:  no change  Labs/ tests ordered today include:   Orders Placed This Encounter  Procedures  . EKG 12-Lead  . ECHOCARDIOGRAM COMPLETE      Disposition:   FU with me in 6 months      Signed, Jenkins Rouge, MD  01/06/2018 11:11 AM    [facility] [place], [place], [place]  [address] Phone: [phone]; Fax: [phone]

## 2018-01-06 ENCOUNTER — Encounter: Payer: Self-pay | Admitting: Cardiovascular Disease

## 2018-01-06 ENCOUNTER — Ambulatory Visit: Payer: Medicare Other | Admitting: Cardiovascular Disease

## 2018-01-06 VITALS — BP 196/80 | HR 62 | Ht 64.0 in | Wt 160.0 lb

## 2018-01-06 DIAGNOSIS — I6523 Occlusion and stenosis of bilateral carotid arteries: Secondary | ICD-10-CM | POA: Diagnosis not present

## 2018-01-06 DIAGNOSIS — Z951 Presence of aortocoronary bypass graft: Secondary | ICD-10-CM

## 2018-01-06 DIAGNOSIS — R011 Cardiac murmur, unspecified: Secondary | ICD-10-CM | POA: Diagnosis not present

## 2018-01-06 DIAGNOSIS — I25810 Atherosclerosis of coronary artery bypass graft(s) without angina pectoris: Secondary | ICD-10-CM | POA: Diagnosis not present

## 2018-01-06 DIAGNOSIS — I480 Paroxysmal atrial fibrillation: Secondary | ICD-10-CM

## 2018-01-06 NOTE — Patient Instructions (Addendum)
Medication Instructions:  Your physician recommends that you continue on your current medications as directed. Please refer to the Current Medication list given to you today.  Labwork: NONE  Testing/Procedures: Your physician has requested that you have a carotid duplex in 5 months. This test is an ultrasound of the carotid arteries in your neck. It looks at blood flow through these arteries that supply the brain with blood. Allow one hour for this exam. There are no restrictions or special instructions.  Your physician has requested that you have an echocardiogram in 5 months. Echocardiography is a painless test that uses sound waves to create images of your heart. It provides your doctor with information about the size and shape of your heart and how well your heart's chambers and valves are working. This procedure takes approximately one hour. There are no restrictions for this procedure.  Follow-Up: Your physician wants you to follow-up in: 6 months with Dr. Johnsie Cancel. You will receive a reminder letter in the mail two months in advance. If you don't receive a letter, please call our office to schedule the follow-up appointment.   If you need a refill on your cardiac medications before your next appointment, please call your pharmacy.

## 2018-03-14 LAB — HM DIABETES EYE EXAM

## 2018-03-30 ENCOUNTER — Encounter: Payer: Self-pay | Admitting: Physician Assistant

## 2018-04-05 ENCOUNTER — Telehealth: Payer: Self-pay | Admitting: Cardiovascular Disease

## 2018-04-05 NOTE — Telephone Encounter (Signed)
New Message °

## 2018-04-05 NOTE — Telephone Encounter (Signed)
Called Dr. Albesa Seen office back. They wanted to know if patient should be on ASA 81 mg . Informed them that patient has Aspirin on her medication list when she was her for last office visit in May.  Dr. Albesa Seen office made note of this.

## 2018-04-05 NOTE — Telephone Encounter (Signed)
New Message   Debra from Dr. Albesa Seen offices states Dr. Lurene Shadow says it is ok for the pt to start taking her baby aspirin

## 2018-05-01 LAB — HM MAMMOGRAPHY

## 2018-05-11 ENCOUNTER — Encounter: Payer: Self-pay | Admitting: Physician Assistant

## 2018-06-13 ENCOUNTER — Other Ambulatory Visit: Payer: Self-pay

## 2018-06-13 ENCOUNTER — Ambulatory Visit (HOSPITAL_COMMUNITY)
Admission: RE | Admit: 2018-06-13 | Payer: Medicare Other | Source: Ambulatory Visit | Attending: Cardiovascular Disease | Admitting: Cardiovascular Disease

## 2018-06-13 ENCOUNTER — Ambulatory Visit (HOSPITAL_COMMUNITY): Payer: Medicare Other | Attending: Cardiovascular Disease

## 2018-06-13 DIAGNOSIS — I480 Paroxysmal atrial fibrillation: Secondary | ICD-10-CM

## 2018-06-13 DIAGNOSIS — Z951 Presence of aortocoronary bypass graft: Secondary | ICD-10-CM

## 2018-06-13 DIAGNOSIS — I6523 Occlusion and stenosis of bilateral carotid arteries: Secondary | ICD-10-CM

## 2018-06-13 DIAGNOSIS — R011 Cardiac murmur, unspecified: Secondary | ICD-10-CM

## 2018-06-13 DIAGNOSIS — I25810 Atherosclerosis of coronary artery bypass graft(s) without angina pectoris: Secondary | ICD-10-CM | POA: Diagnosis present

## 2018-06-19 NOTE — Progress Notes (Signed)
Subjective:   [name] is a 72 y.o. female who presents for an Initial Medicare Annual Wellness Visit.  Review of Systems    No ROS.  Medicare Wellness Visit. Additional risk factors are reflected in the social history.   Cardiac Risk Factors include: dyslipidemia;diabetes mellitus;advanced age (>43men, >40 women);hypertension Sleep patterns: Takes Melatonin at bedtime. Getting 6-7 hours of sleep. Feels rested upon wakening. Gets up 1-2 times a night to go to the bathroom.  Home Safety/Smoke Alarms: Feels safe in home. Smoke alarms in place.  Living environment; Lives with husband in townhome no steps. Shower is a step over tub and grab bars are in place.     Female:   Pap- aged out      [place]-  utd     Dexa scan-  ordered      [facility]- utd Eye Exam- utd    Objective:    Today's Vitals   06/26/18 1107  BP: 140/63  Pulse: 74  SpO2: 100%  Weight: 162 lb (73.5 kg)  Height: 5\' 4"  (1.626 m)   Body mass index is 27.81 kg/m.  Advanced Directives 06/26/2018 10/18/2016 10/08/2016 10/01/2016 09/30/2016 09/20/2016 09/17/2016  Does Patient Have a Medical Advance Directive? No Yes Yes Yes Yes;No No No  Type of Advance Directive - Healthcare Power of [place];Living will [place];Living will [place];Living will - - -  Does patient want to make changes to medical advance directive? - No - Patient declined - - - - -  Copy of [place] in Chart? - No - copy requested - No - copy requested - - -  Would patient like information on creating a medical advance directive? Yes (MAU/Ambulatory/Procedural Areas - Information given) - - - - - -    Current Medications (verified) Outpatient Encounter Medications as of 06/26/2018  Medication Sig  . acetaminophen (TYLENOL) 650 MG CR tablet Take 650 mg by mouth as needed.  Marland Kitchen aspirin EC 81 MG EC tablet Take 1 tablet (81 mg total) by mouth daily.  Marland Kitchen atorvastatin (LIPITOR) 20 MG tablet TAKE 1  TABLET DAILY  . Calcium Carbonate-Vitamin D (CALTRATE 600+D PO) Take 1 tablet by mouth daily.   Marland Kitchen diltiazem (CARDIZEM CD) 120 MG 24 hr capsule Take 1 capsule (120 mg total) by mouth daily.  . Insulin Human (INSULIN PUMP) SOLN Inject 1.5-5.5 each into the skin 3 (three) times daily. humalog  . lisinopril (PRINIVIL,ZESTRIL) 10 MG tablet Take 1 tablet (10 mg total) by mouth daily.  . magnesium oxide (MAG-OX) 400 MG tablet Take 400 mg by mouth daily.  . metFORMIN (GLUCOPHAGE-XR) 500 MG 24 hr tablet 500 mg BID  . omega-3 acid ethyl esters (LOVAZA) 1 g capsule Take 1 g by mouth daily.  . Vitamin D, Cholecalciferol, 400 units CAPS Take 800 Units by mouth daily.    No facility-administered encounter medications on file as of 06/26/2018.     Allergies (verified) Hydrochlorothiazide and Losartan potassium   History: Past Medical History:  Diagnosis Date  . Arthritis   . Cancer [facility])    colon cancer  . Coronary artery disease   . Diabetes mellitus without complication ([place])   . GERD (gastroesophageal reflux disease)   . Heart murmur   . History of blood transfusion   . History of kidney stones   . Hyperlipidemia   . Hypertension   . Iron deficiency anemia due to chronic blood loss 09/17/2016  . 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  PONV (postoperative nausea and vomiting)    gets nauseated easily  . Previous back surgery   . S/P triple vessel bypass    Past Surgical History:  Procedure Laterality Date  . ABDOMINAL HYSTERECTOMY    . APPENDECTOMY    . BACK SURGERY    . CARDIAC CATHETERIZATION N/A 09/30/2016   Procedure: Left Heart Cath and Coronary Angiography;  Surgeon: Belva Crome, MD;  Location: [place] [facility];  Service: Cardiovascular;  Laterality: N/A;  . CHOLECYSTECTOMY    . COLONOSCOPY    . CORONARY ARTERY BYPASS GRAFT N/A 10/11/2016   Procedure: CORONARY ARTERY BYPASS GRAFTING (CABG) times three using left internal mammary artery and right leg saphenous vein;  Surgeon: Ivin Poot, MD;  Location: [place];  Service: Open Heart Surgery;  Laterality: N/A;  . TEE WITHOUT CARDIOVERSION N/A 10/11/2016   Procedure: TRANSESOPHAGEAL ECHOCARDIOGRAM (TEE);  Surgeon: Ivin Poot, MD;  Location: [place];  Service: Open Heart Surgery;  Laterality: N/A;   Family History  Problem Relation Age of Onset  . Heart attack Mother   . Diabetes Mother   . Heart attack Father    Social History   Socioeconomic History  . Marital status: Married    Spouse name: Tommie  . Number of children: 2  . Years of education: 41  . Highest education level: 12th grade  Occupational History  . Occupation: retired    Comment: [employer]  Social Needs  . Financial resource strain: Not hard at all  . Food insecurity:    Worry: Never true    Inability: Never true  . Transportation needs:    Medical: No    Non-medical: No  Tobacco Use  . Smoking status: Never Smoker  . Smokeless tobacco: Never Used  Substance and Sexual Activity  . Alcohol use: No    Alcohol/week: 0.0 standard drinks  . Drug use: No  . Sexual activity: Never  Lifestyle  . Physical activity:    Days per week: 6 days    Minutes per session: 40 min  . Stress: Not at all  Relationships  . Social connections:    Talks on phone: More than three times a week    Gets together: Once a week    Attends religious service: More than 4 times per year    Active member of club or organization: No    Attends meetings of clubs or organizations: Never    Relationship status: Married  Other Topics Concern  . Not on file  Social History Narrative   Clean house during the day. Walks around neighborhood everyday for her exercise. Caffeine daily 1 soda daily    Tobacco Counseling Counseling given: Not Answered   Clinical Intake:                        Activities of Daily Living In your present state of health, do you have any difficulty performing the following activities: 06/26/2018  Hearing? N   Vision? Y  Comment has cataracts  Difficulty concentrating or making decisions? N  Walking or climbing stairs? Y  Comment legs hurt sometimes  Dressing or bathing? N  Doing errands, shopping? N  Preparing Food and eating ? N  Using the Toilet? N  In the past six months, have you accidently leaked urine? N  Do you have problems with loss of bowel control? N  Managing your Medications? N  Managing your Finances? N  Housekeeping  or managing your Housekeeping? N  Some recent data might be hidden     Immunizations and Health Maintenance Immunization History  Administered Date(s) Administered  . Influenza, High Dose Seasonal PF 06/30/2011, 06/13/2018  . Influenza, Seasonal, Injecte, Preservative Fre 05/25/2013, 06/19/2015  . Influenza-Unspecified 06/30/2011, 05/25/2013, 06/19/2015  . Pneumococcal Conjugate-13 09/06/2010, 12/18/2014  . Pneumococcal Polysaccharide-23 07/03/2008, 05/20/2016  . Pneumococcal-Unspecified 05/20/2016  . Tdap 07/03/2008, 12/31/2017  . Zoster 06/14/2014   Health Maintenance Due  Topic Date Due  . Hepatitis C Screening  04/06/1946  . FOOT EXAM  11/25/1954  . DEXA SCAN  11/24/2009  . HEMOGLOBIN A1C  04/07/2017    Patient Care Team: Lavada Mesi as PCP - General (Family Medicine)  Indicate any recent Medical Services you may have received from other than [facility] providers in the past year (date may be approximate).     Assessment:   This is a routine wellness examination for Theodosia.Physical assessment deferred to PCP.   Hearing/Vision screen  Visual Acuity Screening   Right eye Left eye Both eyes  Without correction: 20/50 20/40 20/40   With correction:     Comments: Has cataracts and plans on getting surgery for that next year.  Hearing Screening Comments: Patient got all 3 words correct on the whisper test.  Dietary issues and exercise activities discussed: Current Exercise Habits: Home exercise routine, Type of exercise: walking, Time  (Minutes): 40, Frequency (Times/Week): 6, Weekly Exercise (Minutes/Week): 240, Intensity: Mild, Exercise limited by: None identified Diet eats a pretty healthy diet stays away from fruits because of sugar. Breakfast: english muffin with peanut butter Lunch: salad and soup Dinner: meat, vegetable and a salad Drinks water daily  But not 64 ounces.      Goals    . DIET - INCREASE WATER INTAKE     Increase water intake to 64 ounces a day. Start  Doing brain stimulating exercises like reading, doing crossword puzzles, word search      Depression Screen PHQ 2/9 Scores 06/26/2018 09/12/2017  PHQ - 2 Score 0 0    Fall Risk Fall Risk  06/26/2018  Falls in the past year? Yes  Number falls in past yr: 1  Injury with Fall? No  Risk for fall due to : Impaired balance/gait  Follow up Falls prevention discussed    Is the patient's home free of loose throw rugs in walkways, pet beds, electrical cords, etc?   yes      Grab bars in the bathroom? yes      Handrails on the stairs?   no      Adequate lighting?   yes   Cognitive Function:     6CIT Screen 06/26/2018  What Year? 0 points  What month? 0 points  What time? 0 points  Count back from 20 0 points  Months in reverse 2 points  Repeat phrase 2 points  Total Score 4    Screening Tests Health Maintenance  Topic Date Due  . Hepatitis C Screening  04/09/1946  . FOOT EXAM  11/25/1954  . DEXA SCAN  11/24/2009  . HEMOGLOBIN A1C  04/07/2017  . OPHTHALMOLOGY EXAM  03/15/2019  . MAMMOGRAM  05/02/2019  . COLONOSCOPY  08/25/2026  . TETANUS/TDAP  01/01/2028  . INFLUENZA VACCINE  Completed  . PNA vac Low Risk Adult  Completed       Plan:  Please schedule your next medicare wellness visit with me in 1 yr.  Ms. Creasey , Thank you for taking time  to come for your Medicare Wellness Visit. I appreciate your ongoing commitment to your health goals. Please review the following plan we discussed and let me know if I can assist you in the  future.  Continue doing brain stimulating activities (puzzles, reading, adult coloring books, staying active) to keep memory sharp.   Bring a copy of your living will and/or healthcare power of attorney to your next office visit.    These are the goals we discussed: Goals    . DIET - INCREASE WATER INTAKE     Increase water intake to 64 ounces a day. Start  Doing brain stimulating exercises like reading, doing crossword puzzles, word search       This is a list of the screening recommended for you and due dates:  Health Maintenance  Topic Date Due  .  Hepatitis C: One time screening is recommended by Center for Disease Control  (CDC) for  adults born from [birth year] through 1966.   [birth year]  . Complete foot exam   11/25/1954  . DEXA scan (bone density measurement)  11/24/2009  . Hemoglobin A1C  04/07/2017  . Eye exam for diabetics  03/15/2019  . Mammogram  05/02/2019  . Colon Cancer Screening  08/25/2026  . Tetanus Vaccine  01/01/2028  . Flu Shot  Completed  . Pneumonia vaccines  Completed     I have personally reviewed and noted the following in the patient's chart:   . Medical and social history . Use of alcohol, tobacco or illicit drugs  . Current medications and supplements . Functional ability and status . Nutritional status . Physical activity . Advanced directives . List of other physicians . Hospitalizations, surgeries, and ER visits in previous 12 months . Vitals . Screenings to include cognitive, depression, and falls . Referrals and appointments  In addition, I have reviewed and discussed with patient certain preventive protocols, quality metrics, and best practice recommendations. A written personalized care plan for preventive services as well as general preventive health recommendations were provided to patient.     Joanne Chars, LPN   50/27/7412

## 2018-06-26 ENCOUNTER — Ambulatory Visit (INDEPENDENT_AMBULATORY_CARE_PROVIDER_SITE_OTHER): Payer: Medicare Other | Admitting: *Deleted

## 2018-06-26 VITALS — BP 140/63 | HR 74 | Ht 64.0 in | Wt 162.0 lb

## 2018-06-26 DIAGNOSIS — Z0000 Encounter for general adult medical examination without abnormal findings: Secondary | ICD-10-CM | POA: Diagnosis not present

## 2018-06-26 DIAGNOSIS — Z13820 Encounter for screening for osteoporosis: Secondary | ICD-10-CM

## 2018-06-26 NOTE — Patient Instructions (Addendum)
Please schedule your next medicare wellness visit with me in 1 yr.  [name] , Thank you for taking time to come for your Medicare Wellness Visit. I appreciate your ongoing commitment to your health goals. Please review the following plan we discussed and let me know if I can assist you in the future.  Continue doing brain stimulating activities (puzzles, reading, adult coloring books, staying active) to keep memory sharp.  Bring a copy of your living will and/or healthcare power of attorney to your next office visit.    These are the goals we discussed: Goals    . DIET - INCREASE WATER INTAKE     Increase water intake to 64 ounces a day. Start  Doing brain stimulating exercises like reading, doing crossword puzzles, word search

## 2018-06-28 ENCOUNTER — Other Ambulatory Visit: Payer: Medicare Other

## 2018-07-05 ENCOUNTER — Ambulatory Visit (INDEPENDENT_AMBULATORY_CARE_PROVIDER_SITE_OTHER): Payer: Medicare Other

## 2018-07-05 ENCOUNTER — Encounter: Payer: Self-pay | Admitting: Physician Assistant

## 2018-07-05 DIAGNOSIS — M85851 Other specified disorders of bone density and structure, right thigh: Secondary | ICD-10-CM

## 2018-07-05 DIAGNOSIS — M8580 Other specified disorders of bone density and structure, unspecified site: Secondary | ICD-10-CM | POA: Insufficient documentation

## 2018-07-05 NOTE — Progress Notes (Signed)
Call pt: bone density show low bone mass but NO osteoporosis. Make sure on at least vitamin D 1000 units and calcium 4 servings of diary or 1300mg  daily.

## 2018-08-08 ENCOUNTER — Other Ambulatory Visit: Payer: Self-pay | Admitting: Cardiovascular Disease

## 2018-09-28 ENCOUNTER — Other Ambulatory Visit: Payer: Self-pay | Admitting: Cardiovascular Disease

## 2018-09-28 DIAGNOSIS — I6523 Occlusion and stenosis of bilateral carotid arteries: Secondary | ICD-10-CM

## 2018-10-06 ENCOUNTER — Telehealth: Payer: Self-pay | Admitting: Cardiovascular Disease

## 2018-10-06 ENCOUNTER — Encounter (INDEPENDENT_AMBULATORY_CARE_PROVIDER_SITE_OTHER): Payer: Self-pay

## 2018-10-06 ENCOUNTER — Encounter: Payer: Self-pay | Admitting: Physician Assistant

## 2018-10-06 ENCOUNTER — Ambulatory Visit (HOSPITAL_COMMUNITY)
Admission: RE | Admit: 2018-10-06 | Discharge: 2018-10-06 | Disposition: A | Discharge status: Home / Self Care | Payer: Medicare Other | Source: Ambulatory Visit | Attending: Cardiology | Admitting: Cardiology

## 2018-10-06 DIAGNOSIS — I6523 Occlusion and stenosis of bilateral carotid arteries: Secondary | ICD-10-CM | POA: Insufficient documentation

## 2018-10-06 DIAGNOSIS — I2510 Atherosclerotic heart disease of native coronary artery without angina pectoris: Secondary | ICD-10-CM

## 2018-10-06 DIAGNOSIS — I6529 Occlusion and stenosis of unspecified carotid artery: Secondary | ICD-10-CM | POA: Insufficient documentation

## 2018-10-06 NOTE — Telephone Encounter (Signed)
New message      Pt calling back to discuss test  results

## 2018-10-06 NOTE — Telephone Encounter (Signed)
Patient aware of results. Per Dr. Nishan, Plaque no stenosis f/u carotid duplex in 2 years. Patient verbalized understanding.  

## 2018-11-17 ENCOUNTER — Other Ambulatory Visit: Payer: Self-pay | Admitting: Cardiovascular Disease

## 2018-11-29 IMAGING — NM NM MISC PROCEDURE
6 series · 36 of 36 positions shown · non-contrast
Comparison: none

[Series 1: rest · 6.51mm/px · 6 of 64 frames shown]
[frame 6/64]
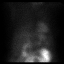
[frame 16/64]
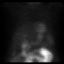
[frame 27/64]
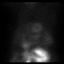
[frame 38/64]
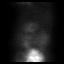
[frame 48/64]
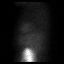
[frame 59/64]
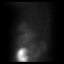

[Series 1: wbr_r-proj_st rest · 6.51mm/px · 6 of 64 frames shown]
[frame 6/64]
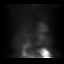
[frame 16/64]
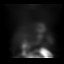
[frame 27/64]
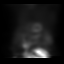
[frame 38/64]
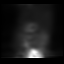
[frame 48/64]
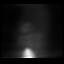
[frame 59/64]
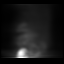

[Series 2: wbr_s-proj_st stress · 6.51mm/px · 6 of 64 frames shown (1 of 2)]
[frame 6/64]
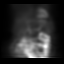
[frame 16/64]
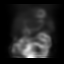
[frame 27/64]
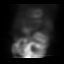
[frame 38/64]
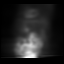
[frame 48/64]
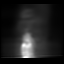
[frame 59/64]
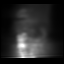

[Series 2: stress · 6.51mm/px · 6 of 512 frames shown (1 of 2)]
[frame 43/512]
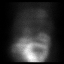
[frame 128/512]
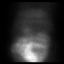
[frame 214/512]
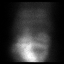
[frame 299/512]
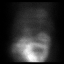
[frame 384/512]
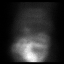
[frame 470/512]
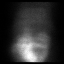

[Series 2: wbr_s-proj_st stress · 6.51mm/px · 6 of 512 frames shown (2 of 2)]
[frame 43/512]
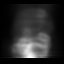
[frame 128/512]
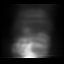
[frame 214/512]
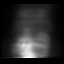
[frame 299/512]
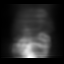
[frame 384/512]
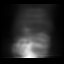
[frame 470/512]
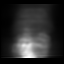

[Series 2: stress · 6.51mm/px · 6 of 64 frames shown (2 of 2)]
[frame 6/64]
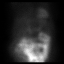
[frame 16/64]
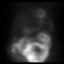
[frame 27/64]
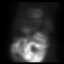
[frame 38/64]
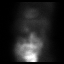
[frame 48/64]
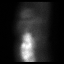
[frame 59/64]
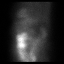

[36 of 36 positions shown; findings below may reference images not displayed]

Canned report from images found in remote index.

Refer to host system for actual result text.

## 2019-01-02 ENCOUNTER — Encounter: Payer: Self-pay | Admitting: Neurology

## 2019-02-20 ENCOUNTER — Other Ambulatory Visit: Payer: Self-pay | Admitting: Cardiovascular Disease

## 2019-03-02 LAB — HEMOGLOBIN A1C: Hemoglobin A1C: 6.7

## 2019-03-16 ENCOUNTER — Encounter: Payer: Self-pay | Admitting: Physician Assistant

## 2019-04-02 ENCOUNTER — Other Ambulatory Visit: Payer: Self-pay | Admitting: Cardiovascular Disease

## 2019-04-03 NOTE — Progress Notes (Signed)
Cardiology Office Note   Date:  04/06/2019   ID:  [name], DOB 22-Jun-1946, MRN [id number]  PCP:  Donella Stade, PA-C  Cardiologist:   Jenkins Rouge, MD   No chief complaint on file.     History of Present Illness:  73 y.o. first seen 09/21/16 preop. Needed colectomy for colon cancer. However do to IDDM had abnormal myovue with subsequent cath showed distal LM and 3VD Subsequently had CABG   10/11/16 LIMA LAD SVG D1 SVG OM1  Post op course with PAF and post termination pauses. Seen by Dr Lovena Le EP  CHADVASC 3 but due to anemia and cecal  Cancer not started on anticoagulation. Echo 06/13/18  showed normal atrial sizes EF 60-65% mild AS mean gradient 11 mmHg EF 60-65%   She is seeing [facility]  She had ileocolectomy May 2018  She has had chemo for two liver lesions with good response. 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 Arthritis   . Cancer [facility])    colon cancer  . Coronary artery disease   . Diabetes mellitus without complication ([place])   . GERD (gastroesophageal reflux disease)   . Heart murmur   . History of blood transfusion   . History of kidney stones   . Hyperlipidemia   . Hypertension   . Iron deficiency anemia due to chronic blood loss 09/17/2016  . 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 PONV (postoperative nausea and vomiting)    gets nauseated easily  . Previous back surgery   . S/P triple vessel bypass     Past Surgical History:  Procedure Laterality Date  . ABDOMINAL HYSTERECTOMY    . APPENDECTOMY    . BACK SURGERY    . CARDIAC CATHETERIZATION N/A 09/30/2016   Procedure: Left Heart Cath and Coronary Angiography;  Surgeon: Belva Crome, MD;  Location: [place] [facility];  Service:  Cardiovascular;  Laterality: N/A;  . CHOLECYSTECTOMY    . COLONOSCOPY    . CORONARY ARTERY BYPASS GRAFT N/A 10/11/2016   Procedure: CORONARY ARTERY BYPASS GRAFTING (CABG) times three using left internal mammary artery and right leg saphenous vein;  Surgeon: Ivin Poot, MD;  Location: [place];  Service: Open Heart Surgery;  Laterality: N/A;  . TEE WITHOUT CARDIOVERSION N/A 10/11/2016   Procedure: TRANSESOPHAGEAL ECHOCARDIOGRAM (TEE);  Surgeon: Ivin Poot, MD;  Location: [facility];  Service: Open Heart Surgery;  Laterality: N/A;     Current Outpatient Medications  Medication Sig Dispense Refill  . acetaminophen (TYLENOL) 650 MG CR tablet Take 650 mg by mouth as needed.    Marland Kitchen aspirin EC 81 MG EC tablet Take 1 tablet (81 mg total) by mouth daily.    . Calcium Carbonate-Vitamin D (CALTRATE 600+D PO) Take 1 tablet by mouth daily.     Marland Kitchen diltiazem (CARTIA XT) 120 MG 24 hr capsule Take 1 capsule (120 mg total) by mouth daily. 30 capsule 9  . ezetimibe (ZETIA) 10 MG tablet Take 10 mg by mouth daily.    . Insulin Human (INSULIN PUMP) SOLN Inject 1.5-5.5 each into the skin 3 (three) times daily. humalog    . lidocaine-prilocaine (EMLA) cream APPLY TOPICALLY  TO PORT SITE ONE HOUR PRIOR TO EACH TREATMENT AS NEEDED    . lisinopril (ZESTRIL) 10 MG tablet Take 1 tablet (10 mg total) by mouth daily. 30 tablet 9  . magnesium oxide (MAG-OX) 400 MG tablet Take 400 mg by mouth daily.    . metFORMIN (GLUCOPHAGE-XR) 500 MG 24 hr tablet 500 mg BID  5  . omega-3 acid ethyl esters (LOVAZA) 1 g capsule Take 1 g by mouth daily.    . ondansetron (ZOFRAN) 4 MG tablet TAKE 1 TABLET BY MOUTH EVERY 8 HOURS AS NEEDED FOR NAUSEA FOR UP TO 5 DAYS    . Pediatric Multivitamins-Iron (FLINTSTONES COMPLETE) 18 MG CHEW Chew 1 tablet by mouth daily.    . Vitamin D, Cholecalciferol, 400 units CAPS Take 800 Units by mouth daily.      No current facility-administered medications for this visit.     Allergies:   Hydrochlorothiazide  and Losartan potassium    Social History:  The patient  reports that she has never smoked. She has never used smokeless tobacco. She reports that she does not drink alcohol or use drugs.   Family History:  The patient's family history includes Diabetes in her mother; Heart attack in her father and mother.    ROS:  Please see the history of present illness.   Otherwise, review of systems are positive for none.   All other systems are reviewed and negative.    PHYSICAL EXAM: VS:  BP 134/62   Pulse (!) 57   Ht 5\' 4"  (1.626 m)   Wt 158 lb 12.8 oz (72 kg)   SpO2 99%   BMI 27.26 kg/m  , BMI Body mass index is 27.26 kg/m. Affect appropriate Healthy:  appears stated age 45: normal Neck supple with no adenopathy JVP normal left  bruits no thyromegaly Lungs clear with no wheezing and good diaphragmatic motion Heart:  S1/S2 AS  murmur, no rub, gallop or click PMI normal post sternotomy  Abdomen: post colectomy with insulin pump no bruit.  No HSM or HJR Distal pulses intact with no bruits No edema Neuro non-focal Skin warm and dry No muscular weakness PIC line RUE    EKG:   NSR rate 68 normal 01/06/18   Recent Labs: No results found for requested labs within last 8760 hours.    Lipid Panel    Component Value Date/Time   CHOL 147 09/12/2017 1152   TRIG 212 (H) 09/12/2017 1152   HDL 56 09/12/2017 1152   CHOLHDL 2.6 09/12/2017 1152   VLDL 72 (H) 01/13/2016 0842   LDLCALC 63 09/12/2017 1152      Wt Readings from Last 3 Encounters:  04/06/19 158 lb 12.8 oz (72 kg)  06/26/18 162 lb (73.5 kg)  01/06/18 160 lb (72.6 kg)      Other studies Reviewed: Additional studies/ records that were reviewed today include: Hospital notes cath CABG and d/c summary ECG's February 2018 .    ASSESSMENT AND PLAN:  1. CAD/CABG stable no angina continue medical Rx 2. 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 DM Discussed low carb diet.  Target hemoglobin A1c is 6.5 or less.  Continue current medications. 6. Murmur:  Mild AS with mean gradient 11 mmHg TTE done 06/13/18 f/u ordered for 6 months especially since she  is back on chemo 7. Bruit: left duplex 10/06/18 plaque no stenosis f/u January 2022   Current medicines are reviewed at length with the patient today.  The patient does not have concerns regarding medicines.  The following changes have been made:  no change  Labs/ tests ordered today include:   No orders of the defined types were placed in this encounter.    Disposition:   FU with me in 6 months      Signed, Jenkins Rouge, MD  04/06/2019 9:02 AM    [facility] [address], [place], [place]  [address] Phone: [phone]; Fax: [phone]

## 2019-04-06 ENCOUNTER — Ambulatory Visit (INDEPENDENT_AMBULATORY_CARE_PROVIDER_SITE_OTHER): Payer: Medicare Other | Admitting: Cardiovascular Disease

## 2019-04-06 ENCOUNTER — Encounter: Payer: Self-pay | Admitting: Cardiovascular Disease

## 2019-04-06 ENCOUNTER — Other Ambulatory Visit: Payer: Self-pay

## 2019-04-06 VITALS — BP 134/62 | HR 57 | Ht 64.0 in | Wt 158.8 lb

## 2019-04-06 DIAGNOSIS — Z951 Presence of aortocoronary bypass graft: Secondary | ICD-10-CM

## 2019-04-06 DIAGNOSIS — R011 Cardiac murmur, unspecified: Secondary | ICD-10-CM

## 2019-04-06 NOTE — Addendum Note (Signed)
Addended by: Emmaline Life on: 04/06/2019 09:19 AM   Modules accepted: Orders

## 2019-04-06 NOTE — Patient Instructions (Signed)
Medication Instructions:  Your physician recommends that you continue on your current medications as directed. Please refer to the Current Medication list given to you today.  If you need a refill on your cardiac medications before your next appointment, please call your pharmacy.   Lab work: None Ordered   Testing/Procedures: Your physician has requested that you have an echocardiogram. Echocardiography is a painless test that uses sound waves to create images of your heart. It provides your doctor with information about the size and shape of your heart and how well your heart's chambers and valves are working. This procedure takes approximately one hour. There are no restrictions for this procedure.    Follow-Up: At [facility], you and your health needs are our priority.  As part of our continuing mission to provide you with exceptional heart care, we have created designated Provider Care Teams.  These Care Teams include your primary Cardiologist (physician) and Advanced Practice Providers (APPs -  Physician Assistants and Nurse Practitioners) who all work together to provide you with the care you need, when you need it. You will need a follow up appointment in 6 months.  Please call our office 2 months in advance to schedule this appointment.  You may see Dr. Johnsie Cancel or one of the following Advanced Practice Providers on your designated Care Team:   Truitt Merle, NP Cecilie Kicks, NP . Kathyrn Drown, NP

## 2019-04-17 ENCOUNTER — Ambulatory Visit (HOSPITAL_COMMUNITY): Payer: Medicare Other | Attending: Cardiology

## 2019-04-17 ENCOUNTER — Other Ambulatory Visit: Payer: Self-pay

## 2019-04-17 DIAGNOSIS — R011 Cardiac murmur, unspecified: Secondary | ICD-10-CM | POA: Diagnosis present

## 2019-04-17 DIAGNOSIS — Z951 Presence of aortocoronary bypass graft: Secondary | ICD-10-CM | POA: Diagnosis present

## 2019-05-11 MED ORDER — GENERIC EXTERNAL MEDICATION
Status: DC
Start: ? — End: 2019-05-11

## 2019-05-11 MED ORDER — ACETAMINOPHEN 325 MG PO TABS
650.00 | ORAL_TABLET | ORAL | Status: DC
Start: ? — End: 2019-05-11

## 2019-05-11 MED ORDER — ENOXAPARIN SODIUM 40 MG/0.4ML ~~LOC~~ SOLN
40.00 | SUBCUTANEOUS | Status: DC
Start: 2019-05-11 — End: 2019-05-11

## 2019-05-11 MED ORDER — GENERIC EXTERNAL MEDICATION
0.08 | Status: DC
Start: ? — End: 2019-05-11

## 2019-05-11 MED ORDER — GENERIC EXTERNAL MEDICATION
15.00 | Status: DC
Start: ? — End: 2019-05-11

## 2019-05-11 MED ORDER — GENERIC EXTERNAL MEDICATION
600.00 | Status: DC
Start: ? — End: 2019-05-11

## 2019-05-11 MED ORDER — FLUCONAZOLE 200 MG PO TABS
400.00 | ORAL_TABLET | ORAL | Status: DC
Start: 2019-05-11 — End: 2019-05-11

## 2019-05-11 MED ORDER — PROMETHAZINE HCL 25 MG/ML IJ SOLN
12.50 | INTRAMUSCULAR | Status: DC
Start: ? — End: 2019-05-11

## 2019-05-11 MED ORDER — SODIUM CHLORIDE (PF) 0.9 % IJ SOLN
10.00 | INTRAMUSCULAR | Status: DC
Start: ? — End: 2019-05-11

## 2019-05-11 MED ORDER — INSULIN LISPRO 100 UNIT/ML ~~LOC~~ SOLN
1.00 | SUBCUTANEOUS | Status: DC
Start: 2019-05-10 — End: 2019-05-11

## 2019-05-11 MED ORDER — ALPRAZOLAM 0.5 MG PO TABS
0.50 | ORAL_TABLET | ORAL | Status: DC
Start: ? — End: 2019-05-11

## 2019-05-11 MED ORDER — HYDRALAZINE HCL 20 MG/ML IJ SOLN
10.00 | INTRAMUSCULAR | Status: DC
Start: ? — End: 2019-05-11

## 2019-05-11 MED ORDER — INSULIN GLARGINE 100 UNIT/ML ~~LOC~~ SOLN
1.00 | SUBCUTANEOUS | Status: DC
Start: ? — End: 2019-05-11

## 2019-05-11 MED ORDER — GENERIC EXTERNAL MEDICATION
0.04 | Status: DC
Start: ? — End: 2019-05-11

## 2019-05-11 MED ORDER — SODIUM CHLORIDE 0.9 % IV SOLN
10.00 | INTRAVENOUS | Status: DC
Start: ? — End: 2019-05-11

## 2019-05-11 MED ORDER — CLOTRIMAZOLE-BETAMETHASONE 1-0.05 % EX CREA
TOPICAL_CREAM | CUTANEOUS | Status: DC
Start: 2019-05-10 — End: 2019-05-11

## 2019-05-11 MED ORDER — CALCIUM CARBONATE ANTACID 500 MG PO CHEW
1000.00 | CHEWABLE_TABLET | ORAL | Status: DC
Start: ? — End: 2019-05-11

## 2019-05-11 MED ORDER — FIDAXOMICIN 200 MG PO TABS
200.00 | ORAL_TABLET | ORAL | Status: DC
Start: 2019-05-10 — End: 2019-05-11

## 2019-05-11 MED ORDER — INSULIN LISPRO 100 UNIT/ML ~~LOC~~ SOLN
1.00 | SUBCUTANEOUS | Status: DC
Start: ? — End: 2019-05-11

## 2019-05-11 MED ORDER — METOPROLOL TARTRATE 5 MG/5ML IV SOLN
5.00 | INTRAVENOUS | Status: DC
Start: ? — End: 2019-05-11

## 2019-05-11 MED ORDER — MEGESTROL ACETATE 40 MG/ML PO SUSP
800.00 | ORAL | Status: DC
Start: 2019-05-11 — End: 2019-05-11

## 2019-05-11 MED ORDER — DICYCLOMINE HCL 10 MG PO CAPS
10.00 | ORAL_CAPSULE | ORAL | Status: DC
Start: 2019-05-10 — End: 2019-05-11

## 2019-05-11 MED ORDER — POLYVINYL ALCOHOL-POVIDONE PF 1.4-0.6 % OP SOLN
1.00 | OPHTHALMIC | Status: DC
Start: ? — End: 2019-05-11

## 2019-05-11 MED ORDER — BIOTENE ORALBALANCE DRY MOUTH MT GEL
OROMUCOSAL | Status: DC
Start: ? — End: 2019-05-11

## 2019-05-11 MED ORDER — NITROGLYCERIN 0.4 MG SL SUBL
0.40 | SUBLINGUAL_TABLET | SUBLINGUAL | Status: DC
Start: ? — End: 2019-05-11

## 2019-05-11 MED ORDER — VANCOMYCIN HCL 125 MG PO CAPS
125.00 | ORAL_CAPSULE | ORAL | Status: DC
Start: 2019-05-10 — End: 2019-05-11

## 2019-05-11 MED ORDER — ALUM & MAG HYDROXIDE-SIMETH 200-200-20 MG/5ML PO SUSP
30.00 | ORAL | Status: DC
Start: ? — End: 2019-05-11

## 2019-07-02 ENCOUNTER — Ambulatory Visit: Payer: Medicare Other

## 2019-09-17 LAB — HM MAMMOGRAPHY

## 2019-09-25 LAB — HEMOGLOBIN A1C: Hemoglobin A1C: 6.4

## 2019-09-28 ENCOUNTER — Encounter: Payer: Self-pay | Admitting: Physician Assistant

## 2019-10-05 ENCOUNTER — Ambulatory Visit: Payer: Medicare Other | Admitting: Cardiovascular Disease

## 2019-10-17 NOTE — Progress Notes (Deleted)
Cardiology Office Note   Date:  10/17/2019   ID:  [name], DOB [birth year], MRN [id number]  PCP:  Donella [place], PA-C  Cardiologist:   Jenkins Rouge, MD   No chief complaint on file.     History of Present Illness:  74 y.o. first seen 09/21/16 preop. Needed colectomy for colon cancer. However do to IDDM had abnormal myovue with subsequent cath showed distal LM and 3VD Subsequently had CABG   10/11/16 LIMA LAD SVG D1 SVG OM1  Post op course with PAF and post termination pauses. Seen by Dr Lovena Le EP  CHADVASC 3 but due to anemia and cecal  Cancer not started on anticoagulation. Echo 06/13/18  showed normal atrial sizes EF 60-65% mild AS mean gradient 11 mmHg EF 60-65%   She is seeing [facility]  She had ileocolectomy May 2018  She has had chemo for two liver lesions with good response. 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 Arthritis   . Cancer [facility])    colon cancer  . Coronary artery disease   . Diabetes mellitus without complication ([place])   . GERD (gastroesophageal reflux disease)   . Heart murmur   . History of blood transfusion   . History of kidney stones   . Hyperlipidemia   . Hypertension   . Iron deficiency anemia due to chronic blood loss 09/17/2016  . 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 PONV (postoperative nausea and vomiting)    gets nauseated easily  . Previous back surgery   . S/P triple vessel bypass     Past Surgical History:  Procedure Laterality Date  . ABDOMINAL HYSTERECTOMY    . APPENDECTOMY    . BACK SURGERY    . CARDIAC CATHETERIZATION N/A 09/30/2016   Procedure: Left Heart Cath and Coronary Angiography;  Surgeon: Belva Crome, MD;   Location: [place] [facility];  Service: Cardiovascular;  Laterality: N/A;  . CHOLECYSTECTOMY    . COLONOSCOPY    . CORONARY ARTERY BYPASS GRAFT N/A 10/11/2016   Procedure: CORONARY ARTERY BYPASS GRAFTING (CABG) times three using left internal mammary artery and right leg saphenous vein;  Surgeon: Ivin Poot, MD;  Location: [place];  Service: Open Heart Surgery;  Laterality: N/A;  . TEE WITHOUT CARDIOVERSION N/A 10/11/2016   Procedure: TRANSESOPHAGEAL ECHOCARDIOGRAM (TEE);  Surgeon: Ivin Poot, MD;  Location: [place];  Service: Open Heart Surgery;  Laterality: N/A;     Current Outpatient Medications  Medication Sig Dispense Refill  . acetaminophen (TYLENOL) 650 MG CR tablet Take 650 mg by mouth as needed.    Marland Kitchen aspirin EC 81 MG EC tablet Take 1 tablet (81 mg total) by mouth daily.    . Calcium Carbonate-Vitamin D (CALTRATE 600+D PO) Take 1 tablet by mouth daily.     Marland Kitchen diltiazem (CARTIA XT) 120 MG 24 hr capsule Take 1 capsule (120 mg total) by mouth daily. 30 capsule 9  . ezetimibe (ZETIA) 10 MG tablet Take 10 mg by mouth daily.    . Insulin Human (INSULIN PUMP) SOLN Inject 1.5-5.5 each into the skin 3 (three) times daily. humalog    .  lidocaine-prilocaine (EMLA) cream APPLY TOPICALLY TO PORT SITE ONE HOUR PRIOR TO EACH TREATMENT AS NEEDED    . lisinopril (ZESTRIL) 10 MG tablet Take 1 tablet (10 mg total) by mouth daily. 30 tablet 9  . magnesium oxide (MAG-OX) 400 MG tablet Take 400 mg by mouth daily.    . metFORMIN (GLUCOPHAGE-XR) 500 MG 24 hr tablet 500 mg BID  5  . omega-3 acid ethyl esters (LOVAZA) 1 g capsule Take 1 g by mouth daily.    . ondansetron (ZOFRAN) 4 MG tablet TAKE 1 TABLET BY MOUTH EVERY 8 HOURS AS NEEDED FOR NAUSEA FOR UP TO 5 DAYS    . Pediatric Multivitamins-Iron (FLINTSTONES COMPLETE) 18 MG CHEW Chew 1 tablet by mouth daily.    . Vitamin D, Cholecalciferol, 400 units CAPS Take 800 Units by mouth daily.      No current facility-administered medications for this visit.     Allergies:   Hydrochlorothiazide and Losartan potassium    Social History:  The patient  reports that she has never smoked. She has never used smokeless tobacco. She reports that she does not drink alcohol or use drugs.   Family History:  The patient's family history includes Diabetes in her mother; Heart attack in her father and mother.    ROS:  Please see the history of present illness.   Otherwise, review of systems are positive for none.   All other systems are reviewed and negative.    PHYSICAL EXAM: VS:  There were no vitals taken for this visit. , BMI There is no height or weight on file to calculate BMI. Affect appropriate Healthy:  appears stated age 38: normal Neck supple with no adenopathy JVP normal left  bruits no thyromegaly Lungs clear with no wheezing and good diaphragmatic motion Heart:  S1/S2 AS  murmur, no rub, gallop or click PMI normal post sternotomy  Abdomen: post colectomy with insulin pump no bruit.  No HSM or HJR Distal pulses intact with no bruits No edema Neuro non-focal Skin warm and dry No muscular weakness PIC line RUE    EKG:   NSR rate 68 normal 01/06/18   Recent Labs: No results found for requested labs within last 8760 hours.    Lipid Panel    Component Value Date/Time   CHOL 147 09/12/2017 1152   TRIG 212 (H) 09/12/2017 1152   HDL 56 09/12/2017 1152   CHOLHDL 2.6 09/12/2017 1152   VLDL 72 (H) 01/13/2016 0842   LDLCALC 63 09/12/2017 1152      Wt Readings from Last 3 Encounters:  04/06/19 158 lb 12.8 oz (72 kg)  06/26/18 162 lb (73.5 kg)  01/06/18 160 lb (72.6 kg)      Other studies Reviewed: Additional studies/ records that were reviewed today include: Hospital notes cath CABG and d/c summary ECG's February 2018 .Echo 04/17/19 Carotid duplex 10/06/18     ASSESSMENT AND PLAN:  1. CAD/CABG stable no angina continue medical Rx 2. 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 DM Discussed low carb diet.  Target hemoglobin A1c is 6.5 or less.  Continue current medications. 6. Murmur:  AS TTE done 04/17/19 EF >65% mean gradient 11 peak 22 mmHg stable mild f/u echo August 2021 7. Bruit: left duplex 10/06/18 plaque no stenosis f/u  January 2022   Current medicines are reviewed at length with the patient today.  The patient does not have concerns regarding medicines.  The following changes have been made:  no change  Labs/ tests ordered today include: Echo for AS August 2021   No orders of the defined types were placed in this encounter.    Disposition:   FU with me in 6 months      Signed, Jenkins Rouge, MD  10/17/2019 6:34 PM    [address], [place], [place]  [address] Phone: [phone]; Fax: [phone]

## 2019-10-24 NOTE — Progress Notes (Signed)
Virtual Visit via Video Note   This visit type was conducted due to national recommendations for restrictions regarding the COVID-19 Pandemic (e.g. social distancing) in an effort to limit this patient's exposure and mitigate transmission in our community.  Due to her co-morbid illnesses, this patient is at least at moderate risk for complications without adequate follow up.  This format is felt to be most appropriate for this patient at this time.  All issues noted in this document were discussed and addressed.  A limited physical exam was performed with this format.  Please refer to the patient's chart for her consent to telehealth for [facility].   Physician Location: Office Patient Location : Home    Cardiology Office Note   Date:  10/25/2019   ID:  [name], DOB Mar 26, 1946, MRN [id number]  PCP:  Donella St[place], PA-C  Cardiologist:   Jenkins Rouge, MD       History of Present Illness:  74 y.o. first seen 09/21/16 preop. Needed colectomy for colon cancer. However do to IDDM had abnormal myovue with subsequent cath showed distal LM and 3VD Subsequently had CABG   10/11/16 LIMA LAD SVG D1 SVG OM1  Post op course with PAF and post termination pauses. Seen by Dr Lovena Le EP  CHADVASC 3 but due to anemia and cecal  Cancer not started on anticoagulation. Echo 06/13/18  showed normal atrial sizes EF 60-65% mild AS mean gradient 11 mmHg EF 60-65%   She is seeing [facility]  She had ileocolectomy May 2018  She has had chemo for two liver lesions with good response. 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 Arthritis   . Cancer [facility])    colon cancer  . Coronary artery disease   . Diabetes mellitus without complication ([place])   . GERD (gastroesophageal reflux disease)   . Heart murmur   . History of blood transfusion   . History of kidney stones   . Hyperlipidemia   . Hypertension   . Iron deficiency anemia due to chronic blood loss 09/17/2016  . 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 PONV (postoperative nausea and vomiting)    gets nauseated easily  . Previous back surgery   . S/P triple vessel bypass     Past Surgical History:  Procedure Laterality Date  . ABDOMINAL HYSTERECTOMY    . APPENDECTOMY    . BACK SURGERY    . CARDIAC CATHETERIZATION N/A 09/30/2016   Procedure: Left Heart Cath and Coronary Angiography;  Surgeon: Belva Crome, MD;  Location: [place] [facility];  Service: Cardiovascular;  Laterality: N/A;  . CHOLECYSTECTOMY    . COLONOSCOPY    . CORONARY ARTERY BYPASS GRAFT N/A 10/11/2016   Procedure: CORONARY ARTERY BYPASS GRAFTING (CABG) times three using left internal mammary artery and right leg saphenous vein;  Surgeon: Ivin Poot, MD;  Location: [place] [facility];  Service: Open Heart Surgery;  Laterality: N/A;  . TEE WITHOUT CARDIOVERSION N/A 10/11/2016   Procedure: TRANSESOPHAGEAL ECHOCARDIOGRAM (TEE);  Surgeon: Ivin Poot, MD;  Location: [place];  Service: Open Heart Surgery;  Laterality: N/A;     Current Outpatient Medications  Medication Sig Dispense Refill  . acetaminophen (TYLENOL) 650 MG CR tablet Take 650 mg by mouth as needed.    Marland Kitchen aspirin EC 81 MG EC tablet Take 1 tablet (81 mg total) by mouth daily.    . Calcium Carbonate-Vitamin D (CALTRATE 600+D PO) Take 1 tablet by mouth daily.     . cycloSPORINE (RESTASIS) 0.05 % ophthalmic emulsion Place 1 drop into both eyes 2 (two) times daily.    Marland Kitchen diltiazem (CARTIA XT) 120 MG 24 hr capsule  Take 1 capsule (120 mg total) by mouth daily. 30 capsule 9  . ezetimibe (ZETIA) 10 MG tablet Take 10 mg by mouth daily.    . Insulin Human (INSULIN PUMP) SOLN Inject 1.5-5.5 each into the skin 3 (three) times daily. humalog    . lidocaine-prilocaine (EMLA) cream APPLY TOPICALLY TO PORT SITE ONE HOUR PRIOR TO EACH TREATMENT AS NEEDED    . lisinopril (ZESTRIL) 10 MG tablet Take 1 tablet (10 mg total) by mouth daily. 30 tablet 9  . magnesium oxide (MAG-OX) 400 MG tablet Take 400 mg by mouth daily.    . metFORMIN (GLUCOPHAGE-XR) 500 MG 24 hr tablet 500 mg BID  5  . omega-3 acid ethyl esters (LOVAZA) 1 g capsule Take 1 g by mouth daily.    . ondansetron (ZOFRAN) 4 MG tablet TAKE 1 TABLET BY MOUTH EVERY 8 HOURS AS NEEDED FOR NAUSEA FOR UP TO 5 DAYS    . Pediatric Multivitamins-Iron (FLINTSTONES COMPLETE) 18 MG CHEW Chew 1 tablet by mouth daily.    . Vitamin D, Cholecalciferol, 400 units CAPS Take 800 Units by mouth daily.      No current facility-administered medications for this visit.    Allergies:   Hydrochlorothiazide and Losartan potassium    Social History:  The patient  reports that she has never smoked. She has never used smokeless tobacco. She reports that she does not drink alcohol or use drugs.   Family History:  The patient's family history includes Diabetes in her mother; Heart attack in her father and mother.    ROS:  Please see the history of present illness.   Otherwise, review of systems are positive for none.   All other systems are reviewed and negative.    PHYSICAL EXAM: VS:  BP 133/60   Ht 5\' 4"  (1.626 m)   Wt 144 lb (65.3 kg)   BMI 24.72 kg/m  , BMI Body mass index is 24.72 kg/m.  Telephone no exam    EKG:   NSR rate 68 normal 01/06/18   Recent Labs: No results found for requested labs within last 8760 hours.    Lipid Panel    Component Value Date/Time   CHOL 147 09/12/2017 1152   TRIG 212 (H) 09/12/2017 1152   HDL 56 09/12/2017 1152   CHOLHDL 2.6  09/12/2017 1152   VLDL 72 (H) 01/13/2016 0842   LDLCALC 63 09/12/2017 1152      Wt Readings from Last 3 Encounters:  10/25/19 144 lb (65.3 kg)  04/06/19 158 lb 12.8 oz (72 kg)  06/26/18 162 lb (73.5 kg)      Other studies Reviewed: Additional studies/ records that were reviewed today include: Hospital notes cath CABG and d/c summary ECG's February 2018 .  ASSESSMENT AND PLAN:  1. CAD/CABG stable no angina continue medical Rx 2. 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 DM Discussed low carb diet.  Target hemoglobin A1c is 6.5 or less.  Continue current medications. 6. Murmur:  Mild AS with mean gradient 11 mmHg TTE done 06/13/18 f/u ordered   7. Bruit: left duplex 10/06/18 plaque no stenosis f/u January 2022   Current medicines are reviewed at length with the patient today.  The patient does not have concerns regarding medicines.  The following changes have been made:  no change  Labs/ tests ordered today include: Echo for AS   No orders of the defined types were placed in this encounter.    Disposition:   FU with me in a year     Signed, Jenkins Rouge, MD  10/25/2019 10:39 AM    [facility] [place], [place], [place]  [address] Phone: [phone]; Fax: [phone]

## 2019-10-25 ENCOUNTER — Encounter: Payer: Self-pay | Admitting: Cardiovascular Disease

## 2019-10-25 ENCOUNTER — Other Ambulatory Visit: Payer: Self-pay

## 2019-10-25 ENCOUNTER — Telehealth (INDEPENDENT_AMBULATORY_CARE_PROVIDER_SITE_OTHER): Payer: Medicare Other | Admitting: Cardiovascular Disease

## 2019-10-25 ENCOUNTER — Telehealth: Payer: Medicare Other | Admitting: Cardiovascular Disease

## 2019-10-25 VITALS — BP 133/60 | Ht 64.0 in | Wt 144.0 lb

## 2019-10-25 DIAGNOSIS — Z951 Presence of aortocoronary bypass graft: Secondary | ICD-10-CM

## 2019-10-25 DIAGNOSIS — I350 Nonrheumatic aortic (valve) stenosis: Secondary | ICD-10-CM

## 2019-10-25 NOTE — Patient Instructions (Signed)
Medication Instructions:   *If you need a refill on your cardiac medications before your next appointment, please call your pharmacy*  Lab Work:  If you have labs (blood work) drawn today and your tests are completely normal, you will receive your results only by: . MyChart Message (if you have MyChart) OR . A paper copy in the mail If you have any lab test that is abnormal or we need to change your treatment, we will call you to review the results.  Testing/Procedures: Your physician has requested that you have an echocardiogram. Echocardiography is a painless test that uses sound waves to create images of your heart. It provides your doctor with information about the size and shape of your heart and how well your heart's chambers and valves are working. This procedure takes approximately one hour. There are no restrictions for this procedure.  Follow-Up: At [facility], you and your health needs are our priority.  As part of our continuing mission to provide you with exceptional heart care, we have created designated Provider Care Teams.  These Care Teams include your primary Cardiologist (physician) and Advanced Practice Providers (APPs -  Physician Assistants and Nurse Practitioners) who all work together to provide you with the care you need, when you need it.  Your next appointment:   6 months  The format for your next appointment:   In Person  Provider:   You may see Dr. Nishan or one of the following Advanced Practice Providers on your designated Care Team:    Lori Gerhardt, NP  Laura [place], NP  Jill McDaniel, NP    

## 2019-10-25 NOTE — Addendum Note (Signed)
Addended by: Aris Georgia, Cevin Rubinstein L on: 10/25/2019 11:02 AM   Modules accepted: Orders

## 2019-11-08 ENCOUNTER — Other Ambulatory Visit (HOSPITAL_COMMUNITY): Payer: Medicare Other

## 2019-11-13 ENCOUNTER — Other Ambulatory Visit (HOSPITAL_COMMUNITY): Payer: Medicare Other

## 2019-11-26 ENCOUNTER — Ambulatory Visit (HOSPITAL_COMMUNITY): Payer: Medicare Other | Attending: Cardiovascular Disease

## 2019-11-26 ENCOUNTER — Other Ambulatory Visit: Payer: Self-pay

## 2019-11-26 DIAGNOSIS — Z951 Presence of aortocoronary bypass graft: Secondary | ICD-10-CM

## 2019-11-26 DIAGNOSIS — I350 Nonrheumatic aortic (valve) stenosis: Secondary | ICD-10-CM

## 2019-11-27 ENCOUNTER — Telehealth: Payer: Self-pay

## 2019-11-27 NOTE — Telephone Encounter (Signed)
-----   Message from Josue Hector, MD sent at 11/27/2019  7:24 AM EDT -----  LV EF normal no bad valves overall good

## 2019-11-27 NOTE — Telephone Encounter (Signed)
Patient returning Pam's call.

## 2019-11-27 NOTE — Telephone Encounter (Signed)
Left message for patient to call back  

## 2019-11-27 NOTE — Telephone Encounter (Signed)
Called patient back. Patient aware of results.  

## 2019-11-29 ENCOUNTER — Other Ambulatory Visit (HOSPITAL_COMMUNITY): Payer: Medicare Other

## 2019-12-26 ENCOUNTER — Encounter: Payer: Self-pay | Admitting: Family Medicine

## 2019-12-26 ENCOUNTER — Other Ambulatory Visit: Payer: Self-pay

## 2019-12-26 ENCOUNTER — Ambulatory Visit: Payer: Medicare Other | Admitting: Family Medicine

## 2019-12-26 DIAGNOSIS — M7551 Bursitis of right shoulder: Secondary | ICD-10-CM

## 2019-12-26 DIAGNOSIS — M7550 Bursitis of unspecified shoulder: Secondary | ICD-10-CM | POA: Insufficient documentation

## 2019-12-26 MED ORDER — METHYLPREDNISOLONE 4 MG PO TBPK: ORAL_TABLET | ORAL | 0 refills | Status: DC

## 2019-12-26 NOTE — Progress Notes (Signed)
[name] - 74 y.o. female MRN [id number]  Date of birth: 08-Dec-1945  Subjective Chief Complaint  Patient presents with  . Arm Pain    HPI  [name] is 74 y.o. female with history of colon cancer, PAF, HTN and CAD here today with complaint of R arm/shoulder pain.  She reports that symptoms started about 2 weeks ago.  She denies any known injury or overuse.  She thinks it may be from sleep positioning.  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  She has mild weakness of the shoulder.  She has not had much improvement with tylenol.   ROS:  A comprehensive ROS was completed and negative except as noted per HPI  Allergies  Allergen Reactions  . Hydrochlorothiazide Other (See Comments)    Other reaction(s): Other HYPERCALCEMIA HYPERCALCEMIA  . Losartan Potassium     Other reaction(s): Other Increased eosinophils    Past Medical History:  Diagnosis Date  . Arthritis   . Cancer [facility])    colon cancer  . Coronary artery disease   . Diabetes mellitus without complication ([place])   . GERD (gastroesophageal reflux disease)   . Heart murmur   . History of blood transfusion   . History of kidney stones   . Hyperlipidemia   . Hypertension   . Iron deficiency anemia due to chronic blood loss 09/17/2016  . 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 PONV (postoperative nausea and vomiting)    gets nauseated easily  . Previous back surgery   . S/P triple vessel bypass     Past Surgical History:  Procedure Laterality Date  . ABDOMINAL HYSTERECTOMY    . APPENDECTOMY    . BACK SURGERY    . CARDIAC CATHETERIZATION N/A 09/30/2016   Procedure: Left Heart Cath and Coronary Angiography;  Surgeon: Belva Crome, MD;  Location: [place] [facility];  Service: Cardiovascular;  Laterality: N/A;  . CHOLECYSTECTOMY    . COLONOSCOPY    . CORONARY ARTERY BYPASS GRAFT N/A 10/11/2016   Procedure: CORONARY ARTERY BYPASS GRAFTING (CABG) times three using left internal mammary  artery and right leg saphenous vein;  Surgeon: Ivin Poot, MD;  Location: [place];  Service: Open Heart Surgery;  Laterality: N/A;  . TEE WITHOUT CARDIOVERSION N/A 10/11/2016   Procedure: TRANSESOPHAGEAL ECHOCARDIOGRAM (TEE);  Surgeon: Ivin Poot, MD;  Location: [place];  Service: Open Heart Surgery;  Laterality: N/A;    Social History   Socioeconomic History  . Marital status: Married    Spouse name: Tommie  . Number of children: 2  . Years of education: 17  . Highest education level: 12th grade  Occupational History  . Occupation: retired    Comment: [employer]  Tobacco Use  . Smoking status: Never Smoker  . Smokeless tobacco: Never Used  Substance and Sexual Activity  . Alcohol use: No    Alcohol/week: 0.0 standard drinks  . Drug use: No  . Sexual activity: Never  Other Topics Concern  . Not on file  Social History Narrative   Clean house during the day. Walks around neighborhood everyday for her exercise. Caffeine daily 1 soda daily   Social Determinants of Health   Financial Resource Strain:   . Difficulty of Paying Living Expenses:   Food Insecurity:   . Worried About Charity fundraiser in the Last Year:   . Arboriculturist in the Last Year:   Transportation Needs:   . Lack  of Transportation (Medical):   Marland Kitchen Lack of Transportation (Non-Medical):   Physical Activity:   . Days of Exercise per Week:   . Minutes of Exercise per Session:   Stress:   . Feeling of Stress :   Social Connections:   . Frequency of Communication with Friends and Family:   . Frequency of Social Gatherings with Friends and Family:   . Attends Religious Services:   . Active Member of Clubs or Organizations:   . Attends Archivist Meetings:   Marland Kitchen Marital Status:     Family History  Problem Relation Age of Onset  . Heart attack Mother   . Diabetes Mother   . Heart attack Father     Health Maintenance  Topic Date Due  . Hepatitis C Screening  Never done  .  INFLUENZA VACCINE  04/06/2020  . MAMMOGRAM  09/16/2020  . COLONOSCOPY  08/25/2026  . TETANUS/TDAP  01/01/2028  . DEXA SCAN  Completed  . COVID-19 Vaccine  Completed  . PNA vac Low Risk Adult  Completed  . FOOT EXAM  Discontinued  . HEMOGLOBIN A1C  Discontinued  . OPHTHALMOLOGY EXAM  Discontinued     ----------------------------------------------------------------------------------------------------------------------------------------------------------------------------------------------------------------- Physical Exam BP (!) 144/86   Pulse 62   Temp (!) 97.4 F (36.3 C) (Oral)   Ht 5\' 4"  (1.626 m)   Wt 142 lb (64.4 kg)   BMI 24.37 kg/m   Physical Exam Constitutional:      Appearance: Normal appearance.  HENT:     Head: Normocephalic and atraumatic.  Cardiovascular:     Rate and Rhythm: Normal rate and regular rhythm.  Pulmonary:     Effort: Pulmonary effort is normal.     Breath sounds: Normal breath sounds.  Musculoskeletal:     Cervical back: Neck supple.     Comments: Mild ttp over humeral head.  Negative sulcus sign.  Negative drop arm sign.  ROM limited nad painful in internal rotation, flexion/abduction.   Pain with empty can but strength is good.  Hawkins test with pain.   Neurological:     General: No focal deficit present.     Mental Status: She is alert.  Psychiatric:        Mood and Affect: Mood normal.        Behavior: Behavior normal.     ------------------------------------------------------------------------------------------------------------------------------------------------------------------------------------------------------------------- Assessment and Plan  Subacromial bursitis Patient on active chemotherapy treatment, will avoid joint injection unless necessary She can not use NSAIDS due to history of CABG and gastric ulcer.  Tylenol has not been effective.  Given handout of ROM exercise and will start medrol dosepak.   If not improving  will have her schedule with Dr. Darene Lamer for injection.     Meds ordered this encounter  Medications  . methylPREDNISolone (MEDROL DOSEPAK) 4 MG TBPK tablet    Sig: Taper as directed on packaging    Dispense:  21 tablet    Refill:  0    No follow-ups on file.    This visit occurred during the SARS-CoV-2 public health emergency.  Safety protocols were in place, including screening questions prior to the visit, additional usage of staff PPE, and extensive cleaning of exam room while observing appropriate contact time as indicated for disinfecting solutions.

## 2019-12-26 NOTE — Assessment & Plan Note (Signed)
Patient on active chemotherapy treatment, will avoid joint injection unless necessary She can not use NSAIDS due to history of CABG and gastric ulcer.  Tylenol has not been effective.  Given handout of ROM exercise and will start medrol dosepak.   If not improving will have her schedule with Dr. Darene Lamer for injection.

## 2019-12-26 NOTE — Patient Instructions (Signed)
Shoulder Range of Motion Exercises Shoulder range of motion (ROM) exercises are done to keep the shoulder moving freely or to increase movement. They are often recommended for people who have shoulder pain or stiffness or who are recovering from a shoulder surgery. Phase 1 exercises When you are able, do this exercise 1-2 times per day for 30-60 seconds in each direction, or as directed by your health care provider. Pendulum exercise To do this exercise while sitting: 1. Sit in a chair or at the edge of your bed with your feet flat on the floor. 2. Let your affected arm hang down in front of you over the edge of the bed or chair. 3. Relax your shoulder, arm, and hand. 4. Rock your body so your arm gently swings in small circles. You can also use your unaffected arm to start the motion. 5. Repeat changing the direction of the circles, swinging your arm left and right, and swinging your arm forward and back. To do this exercise while standing: 1. Stand next to a sturdy chair or table, and hold on to it with your hand on your unaffected side. 2. Bend forward at the waist. 3. Bend your knees slightly. 4. Relax your shoulder, arm, and hand. 5. While keeping your shoulder relaxed, use body motion to swing your arm in small circles. 6. Repeat changing the direction of the circles, swinging your arm left and right, and swinging your arm forward and back. 7. Between exercises, stand up tall and take a short break to relax your lower back.  Phase 2 exercises Do these exercises 1-2 times per day or as told by your health care provider. Hold each stretch for 30 seconds, and repeat 3 times. Do the exercises with one or both arms as instructed by your health care provider. For these exercises, sit at a table with your hand and arm supported by the table. A chair that slides easily or has wheels can be helpful. External rotation 1. Turn your chair so that your affected side is nearest to the  table. 2. Place your forearm on the table to your side. Bend your elbow about 90 at the elbow (right angle) and place your hand palm facing down on the table. Your elbow should be about 6 inches away from your side. 3. Keeping your arm on the table, lean your body forward. Abduction 1. Turn your chair so that your affected side is nearest to the table. 2. Place your forearm and hand on the table so that your thumb points toward the ceiling and your arm is straight out to your side. 3. Slide your hand out to the side and away from you, using your unaffected arm to do the work. 4. To increase the stretch, you can slide your chair away from the table. Flexion: forward stretch 1. Sit facing the table. Place your hand and elbow on the table in front of you. 2. Slide your hand forward and away from you, using your unaffected arm to do the work. 3. To increase the stretch, you can slide your chair backward. Phase 3 exercises Do these exercises 1-2 times per day or as told by your health care provider. Hold each stretch for 30 seconds, and repeat 3 times. Do the exercises with one or both arms as instructed by your health care provider. Cross-body stretch: posterior capsule stretch 1. Lift your arm straight out in front of you. 2. Bend your arm 90 at the elbow (right angle) so your forearm   moves across your body. 3. Use your other arm to gently pull the elbow across your body, toward your other shoulder. Wall climbs 1. Stand with your affected arm extended out to the side with your hand resting on a door frame. 2. Slide your hand slowly up the door frame. 3. To increase the stretch, step through the door frame. Keep your body upright and do not lean. Wand exercises You will need a cane, a piece of PVC pipe, or a sturdy wooden dowel for wand exercises. Flexion To do this exercise while standing: 1. Hold the wand with both of your hands, palms down. 2. Using the other arm to help, lift your arms  up and over your head, if able. 3. Push upward with your other arm to gently increase the stretch. To do this exercise while lying down: 1. Lie on your back with your elbows resting on the floor and the wand in both your hands. Your hands will be palm down, or pointing toward your feet. 2. Lift your hands toward the ceiling, using your unaffected arm to help if needed. 3. Bring your arms overhead as able, using your unaffected arm to help if needed. Internal rotation 1. Stand while holding the wand behind you with both hands. Your unaffected arm should be extended above your head with the arm of the affected side extended behind you at the level of your waist. The wand should be pointing straight up and down as you hold it. 2. Slowly pull the wand up behind your back by straightening the elbow of your unaffected arm and bending the elbow of your affected arm. External rotation 1. Lie on your back with your affected upper arm supported on a small pillow or rolled towel. When you first do this exercise, keep your upper arm close to your body. Over time, bring your arm up to a 90 angle out to the side. 2. Hold the wand across your stomach and with both hands palm up. Your elbow on your affected side should be bent at a 90 angle. 3. Use your unaffected side to help push your forearm away from you and toward the floor. Keep your elbow on your affected side bent at a 90 angle. Contact a health care provider if you have:  New or increasing pain.  New numbness, tingling, weakness, or discoloration in your arm or hand. This information is not intended to replace advice given to you by your health care provider. Make sure you discuss any questions you have with your health care provider. Document Revised: 10/05/2017 Document Reviewed: 10/05/2017 Elsevier Patient Education  2020 [employer]  

## 2020-03-24 ENCOUNTER — Other Ambulatory Visit: Payer: Self-pay | Admitting: Cardiovascular Disease

## 2020-04-12 ENCOUNTER — Other Ambulatory Visit: Payer: Self-pay | Admitting: Cardiovascular Disease

## 2020-05-02 ENCOUNTER — Other Ambulatory Visit: Payer: Self-pay | Admitting: Cardiovascular Disease

## 2020-05-22 NOTE — Progress Notes (Deleted)
Cardiology Office Note   Date:  05/22/2020   ID:  [name], DOB 31-Jul-1946, MRN [id number]  PCP:  Donella Stade, PA-C  Cardiologist:   Jenkins Rouge, MD      History of Present Illness:  74 y.o. first seen 09/21/16 preop. Needed colectomy for colon cancer. However do to IDDM had abnormal myovue with subsequent cath showed distal LM and 3VD Subsequently had CABG   10/11/16 LIMA LAD SVG D1 SVG OM1  Post op course with PAF and post termination pauses. Seen by Dr Lovena Le EP  CHADVASC 3 but due to anemia and cecal  Cancer not started on anticoagulation. Echo 06/13/18  showed normal atrial sizes EF 60-65% mild AS mean gradient 11 mmHg EF 60-65%   She is seeing [facility]  She had ileocolectomy May 2018  She has had chemo for two liver lesions with good response. 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 and is getting chemo via RUE PIC line every 2 weeks She is a bit disheartened Her husband has MDS and requires a lot of transfusions   No cardiac issues Hb over ten now   In hospital for 2 weeks September then rehab Had to change chemo Had C diff Gets chemo every 2 weeks [place] a cath in August Oncology doctor is Verdell Carmine in [place]    She has had COVID vaccine may benefit from booster   Echo done 11/26/19 with normal EF not read as such but mild AS stable from 2019 with mean gradient 8 peak 17 mmHg EF 60-65%   ***   Past Medical History:  Diagnosis Date   Arthritis    Cancer ([place])    colon cancer   Coronary artery disease    Diabetes mellitus without complication (HCC)    GERD (gastroesophageal reflux disease)    Heart murmur    History of blood transfusion    History of kidney stones    Hyperlipidemia    Hypertension    Iron deficiency anemia due to chronic blood loss 09/17/2016   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   PONV (postoperative nausea and vomiting)    gets nauseated easily    Previous back surgery    S/P triple vessel bypass     Past Surgical History:  Procedure Laterality Date   ABDOMINAL HYSTERECTOMY     APPENDECTOMY     BACK SURGERY     CARDIAC CATHETERIZATION N/A 09/30/2016   Procedure: Left Heart Cath and Coronary Angiography;  Surgeon: Belva Crome, MD;  Location: [facility];  Service: Cardiovascular;  Laterality: N/A;   CHOLECYSTECTOMY     COLONOSCOPY     CORONARY ARTERY BYPASS GRAFT N/A 10/11/2016   Procedure: CORONARY ARTERY BYPASS GRAFTING (CABG) times three using left internal mammary artery and right leg saphenous vein;  Surgeon: Ivin Poot, MD;  Location: [facility];  Service: Open Heart Surgery;  Laterality: N/A;   TEE WITHOUT CARDIOVERSION N/A 10/11/2016   Procedure: TRANSESOPHAGEAL ECHOCARDIOGRAM (TEE);  Surgeon: Ivin Poot, MD;  Location: [facility];  Service: Open Heart Surgery;  Laterality: N/A;     Current Outpatient Medications  Medication Sig Dispense Refill   acetaminophen (TYLENOL) 650 MG CR tablet Take 650 mg by mouth as needed.     aspirin EC 81 MG EC tablet Take 1 tablet (81 mg total) by mouth daily.     Calcium Carbonate-Vitamin D (CALTRATE 600+D PO) Take 1 tablet  by mouth daily.      cycloSPORINE (RESTASIS) 0.05 % ophthalmic emulsion Place 1 drop into both eyes 2 (two) times daily.     diltiazem (CARDIZEM CD) 120 MG 24 hr capsule Take 1 capsule by mouth once daily 30 capsule 11   ezetimibe (ZETIA) 10 MG tablet Take 10 mg by mouth daily.     Insulin Human (INSULIN PUMP) SOLN Inject 1.5-5.5 each into the skin 3 (three) times daily. humalog     lidocaine-prilocaine (EMLA) cream APPLY TOPICALLY TO PORT SITE ONE HOUR PRIOR TO EACH TREATMENT AS NEEDED     lisinopril (ZESTRIL) 10 MG tablet Take 1 tablet by mouth once daily 90 tablet 1   magnesium oxide (MAG-OX) 400 MG tablet Take 400 mg by mouth daily.     metFORMIN (GLUCOPHAGE-XR) 500 MG 24 hr tablet 500 mg BID  5   methylPREDNISolone (MEDROL DOSEPAK) 4 MG  TBPK tablet Taper as directed on packaging 21 tablet 0   omega-3 acid ethyl esters (LOVAZA) 1 g capsule Take 1 g by mouth daily.     ondansetron (ZOFRAN) 4 MG tablet TAKE 1 TABLET BY MOUTH EVERY 8 HOURS AS NEEDED FOR NAUSEA FOR UP TO 5 DAYS     Pediatric Multivitamins-Iron (FLINTSTONES COMPLETE) 18 MG CHEW Chew 1 tablet by mouth daily.     Vitamin D, Cholecalciferol, 400 units CAPS Take 800 Units by mouth daily.      No current facility-administered medications for this visit.    Allergies:   Hydrochlorothiazide and Losartan potassium    Social History:  The patient  reports that she has never smoked. She has never used smokeless tobacco. She reports that she does not drink alcohol and does not use drugs.   Family History:  The patient's family history includes Diabetes in her mother; Heart attack in her father and mother.    ROS:  Please see the history of present illness.   Otherwise, review of systems are positive for none.   All other systems are reviewed and negative.    PHYSICAL EXAM: VS:  There were no vitals taken for this visit. , BMI There is no height or weight on file to calculate BMI.  Affect appropriate Chronically ill female  HEENT: normal Neck supple with no adenopathy JVP normal no bruits no thyromegaly Lungs clear with no wheezing and good diaphragmatic motion Heart:  S1/S2 mild AS  murmur, no rub, gallop or click PMI normal Abdomen: benighn, BS positve, no tenderness, no AAA no bruit.  No HSM or HJR Distal pulses intact with no bruits No edema Neuro non-focal Skin warm and dry No muscular weakness    EKG:   NSR rate 68 normal 01/06/18   Recent Labs: No results found for requested labs within last 8760 hours.    Lipid Panel    Component Value Date/Time   CHOL 147 09/12/2017 1152   TRIG 212 (H) 09/12/2017 1152   HDL 56 09/12/2017 1152   CHOLHDL 2.6 09/12/2017 1152   VLDL 72 (H) 01/13/2016 0842   LDLCALC 63 09/12/2017 1152      Wt Readings  from Last 3 Encounters:  12/26/19 142 lb (64.4 kg)  10/25/19 144 lb (65.3 kg)  04/06/19 158 lb 12.8 oz (72 kg)      Other studies Reviewed: Additional studies/ records that were reviewed today include: Hospital notes cath CABG and d/c summary ECG's February 2018 .    ASSESSMENT AND PLAN:  1. CAD/CABG stable no angina continue medical Rx  2. 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 DM Discussed low carb diet.  Target hemoglobin A1c is 6.5 or less.  Continue current medications. 6. Murmur:   Mild AS by echo 11/26/19 mean gradient 8 peak 17 mmHg stable from 2019  7. Bruit: left duplex 10/06/18 plaque no stenosis f/u January 2022   Current medicines are reviewed at length with the patient today.  The patient does not have concerns regarding medicines.  The following changes have been made:  no change  Labs/ tests ordered today include: Echo for AS March 2022   No orders of the defined types were placed in this encounter.    Disposition:   FU with me in a year     Signed, Jenkins Rouge, MD  05/22/2020 1:46 PM    [facility] [place], [place], [place]  [address] Phone: [phone]; Fax: [phone]

## 2020-05-30 ENCOUNTER — Telehealth: Payer: Self-pay | Admitting: Cardiovascular Disease

## 2020-05-30 NOTE — Telephone Encounter (Signed)
I spoke with patient. She reports she was instructed by cancer doctor to call cardiology to schedule an office visit due to heart rate running low.  Patient reports she has been feeling weak with little energy for several weeks.  No dizziness.  She takes Cardizem in the morning and has treatments around 1:00.  She does not know what heart rate has been running at home and does not know how to check.  She located her BP cuff and was able to see pulse on machine.  I told her this was her heart rate and she should check BP and pulse prior to taking Cardizem.  I advised her to hold Cardizem if pulse less than 60. I scheduled patient to see Truitt Merle, NP on September 28,2021 at 11:45

## 2020-05-30 NOTE — Telephone Encounter (Signed)
Follow Up:     Pt is returning Pam's call from this morning.

## 2020-05-30 NOTE — Telephone Encounter (Signed)
Left message to call back. Chart reviewed and recent visits in Care Everywhere note the following heart rates 9/24-43 9/17-46 9/16-41 9/15-49 8/25-56  Last visit in our office was telemedicine on 10/25/19.  No heart rate recorded Previous visit was 04/06/19 and heart rate was 57

## 2020-06-02 NOTE — Progress Notes (Signed)
CARDIOLOGY OFFICE NOTE  Date:  06/03/2020    Tera Mater Date of Birth: [birth year] Medical Record #[id number]  PCP:  Verdell Carmine, MD  Cardiologist:  Johnsie Cancel  Chief Complaint  Patient presents with  . Follow-up    Seen for Dr. Johnsie Cancel    History of Present Illness: Lawson Isabell is a 74 y.o. female who presents today for a work in visit. Seen for Dr. Johnsie Cancel.   She has a history of IDDM, prior abnormal Myoview with cath and subsequent CABG in February of 2018 with LIMA to LAD, SVG to D1 and SVG to OM1. Post op course she had AF and post termination pauses. CHADSVASC of at least 3 - no anticoagulation due to anemia and cecal cancer.  She had ileocolectomy May 2018  She has had chemo for two liver lesions with good response.   Last seen in February by telehealth visit with Dr. Johnsie Cancel - noted new lung mets and was getting chemo. Husband with MDS and requires lots of transfusions. Cardiac status felt to be ok. She has been vaccinated.   Phone call last week -   I spoke with patient. She reports she was instructed by cancer doctor to call cardiology to schedule an office visit due to heart rate running low.  Patient reports she has been feeling weak with little energy for several weeks.  No dizziness.  She takes Cardizem in the morning and has treatments around 1:00.  She does not know what heart rate has been running at home and does not know how to check.  She located her BP cuff and was able to see pulse on machine.  I told her this was her heart rate and she should check BP and pulse prior to taking Cardizem.  I advised her to hold Cardizem if pulse less than 60.  I scheduled patient to see Truitt Merle, NP on September 28,2021 at 11:45     Thus added to my schedule for today.   Comes in today. Here with her daughter.  Has had recent scans - seem stable by my review. Has also had recent labs. Feels weak and tired. Was sick with GI illness - N/V upset stomach - had to get  fluids. Still feels weak. HR was in the 40's. Has stopped her CCB for the past few days - HR now in the 50's but still weak. BP running higher. No chest pain. Feels pre syncopal.   Was put on Naldolol earlier this month - looks like this was by Gi - following EGD - not able to see those results in Care Everywhere.   Past Medical History:  Diagnosis Date  . Arthritis   . Cancer [facility])    colon cancer  . Coronary artery disease   . Diabetes mellitus without complication ([place])   . GERD (gastroesophageal reflux disease)   . Heart murmur   . History of blood transfusion   . History of kidney stones   . Hyperlipidemia   . Hypertension   . Iron deficiency anemia due to chronic blood loss 09/17/2016  . 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 PONV (postoperative nausea and vomiting)    gets nauseated easily  . Previous back surgery   . S/P triple vessel bypass     Past Surgical History:  Procedure Laterality Date  . ABDOMINAL HYSTERECTOMY    . APPENDECTOMY    . BACK SURGERY    . CARDIAC CATHETERIZATION N/A 09/30/2016   Procedure: Left Heart Cath  and Coronary Angiography;  Surgeon: Belva Crome, MD;  Location: [place] [facility];  Service: Cardiovascular;  Laterality: N/A;  . CHOLECYSTECTOMY    . COLONOSCOPY    . CORONARY ARTERY BYPASS GRAFT N/A 10/11/2016   Procedure: CORONARY ARTERY BYPASS GRAFTING (CABG) times three using left internal mammary artery and right leg saphenous vein;  Surgeon: Ivin Poot, MD;  Location: [place];  Service: Open Heart Surgery;  Laterality: N/A;  . TEE WITHOUT CARDIOVERSION N/A 10/11/2016   Procedure: TRANSESOPHAGEAL ECHOCARDIOGRAM (TEE);  Surgeon: Ivin Poot, MD;  Location: [place];  Service: Open Heart Surgery;  Laterality: N/A;     Medications: Current Meds  Medication Sig  . acetaminophen (TYLENOL) 650 MG CR tablet Take 650 mg by mouth as needed.  . ALPRAZolam (XANAX) 0.5 MG tablet Take 0.5 mg by mouth 3 (three) times daily as needed.  Marland Kitchen aspirin EC 81 MG  EC tablet Take 1 tablet (81 mg total) by mouth daily.  Marland Kitchen Besifloxacin HCl (BESIVANCE) 0.6 % SUSP Place 1 drop into the left eye 3 (three) times a day.  . Calcium Carbonate-Vitamin D (CALTRATE 600+D PO) Take 1 tablet by mouth daily.   . Continuous Blood Gluc Receiver (FREESTYLE LIBRE 2 READER) DEVI by Does not apply route.  . Continuous Blood Gluc Sensor (FREESTYLE LIBRE 14 DAY SENSOR) MISC SMARTSIG:1 Topical Every 2 Weeks  . Continuous Blood Gluc Sensor (FREESTYLE LIBRE 2 SENSOR) MISC by Does not apply route.  . dicyclomine (BENTYL) 10 MG capsule Take 10 mg by mouth 3 (three) times daily.  Marland Kitchen escitalopram (LEXAPRO) 10 MG tablet Take 1 tablet by mouth daily.  Marland Kitchen ezetimibe (ZETIA) 10 MG tablet Take 10 mg by mouth daily.  . Insulin Human (INSULIN PUMP) SOLN Inject 1.5-5.5 each into the skin 3 (three) times daily. humalog  . insulin lispro (HUMALOG) 100 UNIT/ML injection MEDTRONICS 630G.  BASAL 0.6.  I:C 15.  ISF 40.  Total: 40 UNITS/DAY  . lidocaine-prilocaine (EMLA) cream APPLY TOPICALLY TO PORT SITE ONE HOUR PRIOR TO EACH TREATMENT AS NEEDED  . magnesium oxide (MAG-OX) 400 MG tablet Take 400 mg by mouth daily.  . metFORMIN (GLUCOPHAGE-XR) 500 MG 24 hr tablet 500 mg BID  . omeprazole (PRILOSEC) 40 MG capsule Take 1 capsule by mouth daily.  . ondansetron (ZOFRAN) 4 MG tablet TAKE 1 TABLET BY MOUTH EVERY 8 HOURS AS NEEDED FOR NAUSEA FOR UP TO 5 DAYS  . Pediatric Multivitamins-Iron (FLINTSTONES COMPLETE) 18 MG CHEW Chew 1 tablet by mouth daily.  . Prodigy Lancets 28G MISC USE 1  TO CHECK GLUCOSE 4 TIMES DAILY  . promethazine (PHENERGAN) 12.5 MG tablet Take by mouth.  . urea (CARMOL) 10 % cream Apply topically.  . [DISCONTINUED] lisinopril (ZESTRIL) 10 MG tablet Take 1 tablet by mouth once daily  . [DISCONTINUED] nadolol (CORGARD) 20 MG tablet Take 20 mg by mouth daily.     Allergies: Allergies  Allergen Reactions  . Hydrochlorothiazide Other (See Comments)    Other reaction(s):  Other HYPERCALCEMIA HYPERCALCEMIA  . Losartan Potassium     Other reaction(s): Other Increased eosinophils    Social History: The patient  reports that she has never smoked. She has never used smokeless tobacco. She reports that she does not drink alcohol and does not use drugs.   Family History: The patient's family history includes Diabetes in her mother; Heart attack in her father and mother.   Review of Systems: Please see the history of present illness.   All  other systems are reviewed and negative.   Physical Exam: VS:  BP (!) 162/72   Pulse (!) 53   Ht 5\' 4"  (1.626 m)   Wt 132 lb 9.6 oz (60.1 kg)   SpO2 99%   BMI 22.76 kg/m  .  BMI Body mass index is 22.76 kg/m.  Wt Readings from Last 3 Encounters:  06/03/20 132 lb 9.6 oz (60.1 kg)  12/26/19 142 lb (64.4 kg)  10/25/19 144 lb (65.3 kg)   BP is 160/80 by me.   General: Pleasant. She looks fatigued. Her weight is down.  Cardiac: Regular rate and rhythm but slow. No murmurs, rubs, or gallops. No edema.  Respiratory:  Lungs are clear to auscultation bilaterally with normal work of breathing.  GI: Soft and nontender.  MS: No deformity or atrophy. Gait and ROM intact.  Skin: Warm and dry. Color is normal.  Neuro:  Strength and sensation are intact and no gross focal deficits noted.  Psych: Alert, appropriate and with normal affect.   LABORATORY DATA:  EKG:  EKG is ordered today.  Personally reviewed by me. This demonstrates sinus bradycardia with short PR.   Lab Results  Component Value Date   WBC 5.6 12/31/2016   HGB 10.1 (L) 12/31/2016   HCT 31.3 (L) 12/31/2016   PLT 274 12/31/2016   GLUCOSE 145 (H) 09/12/2017   CHOL 147 09/12/2017   TRIG 212 (H) 09/12/2017   HDL 56 09/12/2017   LDLCALC 63 09/12/2017   ALT 49 (H) 09/12/2017   AST 43 (H) 09/12/2017   NA 139 09/12/2017   K 5.4 (H) 09/12/2017   CL 104 09/12/2017   CREATININE 1.00 (H) 09/12/2017   BUN 23 09/12/2017   CO2 26 09/12/2017   INR 1.30  10/11/2016   HGBA1C 6.4 09/25/2019     BNP (last 3 results) No results for input(s): BNP in the last 8760 hours.  ProBNP (last 3 results) No results for input(s): PROBNP in the last 8760 hours.   Other Studies Reviewed Today:  ECHO IMPRESSIONS 11/2019  1. Left ventricular ejection fraction, by estimation, is 60 to 65%. The  left ventricle has normal function. The left ventricle has no regional  wall motion abnormalities. Left ventricular diastolic parameters are  consistent with Grade I diastolic  dysfunction (impaired relaxation).  2. Right ventricular systolic function is moderately reduced. The right  ventricular size is normal. There is mildly elevated pulmonary artery  systolic pressure.  3. The mitral valve is normal in structure. No evidence of mitral valve  regurgitation. No evidence of mitral stenosis.  4. The aortic valve is normal in structure. Aortic valve regurgitation is  not visualized. No aortic stenosis is present.    ASSESSMENT AND PLAN:  1. Bradycardia - off CCB - but on beta blocker per GI since September 3rd - seems to correlate with her symptoms - not sure as to why she was given this - has had reported HRs in the 40's - low 50's here today - stopping this today. Continue to hold Diltiazem as well. They will monitor HR and BP at home - hold on monitor for now unless HR fails to improve.   2. HTN - BP high and not at goal - increasing her ARB - she will monitor.   3. Remote PAF - in sinus today.   4. CAD with prior CABG - no active chest pain.   5. 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  Current medicines are reviewed with the patient today.  The patient does not have concerns regarding medicines other than what has been noted above.  The following changes have been made:  See above.  Labs/ tests ordered today include:    Orders Placed This Encounter  Procedures  . EKG 12-Lead     Disposition:   FU with  Dr. Johnsie Cancel as planned in December.    Patient is agreeable to this plan and will call if any problems develop in the interim.   SignedTruitt Merle, NP  06/03/2020 12:36 PM  [facility] [address] [place], [place]  [address] Phone: [phone] Fax: [phone]

## 2020-06-03 ENCOUNTER — Ambulatory Visit: Payer: Medicare Other | Admitting: Nurse Practitioner

## 2020-06-03 ENCOUNTER — Other Ambulatory Visit: Payer: Self-pay

## 2020-06-03 ENCOUNTER — Encounter: Payer: Self-pay | Admitting: Nurse Practitioner

## 2020-06-03 ENCOUNTER — Encounter: Payer: Self-pay | Admitting: *Deleted

## 2020-06-03 VITALS — BP 162/72 | HR 53 | Ht 64.0 in | Wt 132.6 lb

## 2020-06-03 DIAGNOSIS — I2510 Atherosclerotic heart disease of native coronary artery without angina pectoris: Secondary | ICD-10-CM

## 2020-06-03 DIAGNOSIS — Z951 Presence of aortocoronary bypass graft: Secondary | ICD-10-CM | POA: Diagnosis not present

## 2020-06-03 DIAGNOSIS — R001 Bradycardia, unspecified: Secondary | ICD-10-CM

## 2020-06-03 MED ORDER — LISINOPRIL 20 MG PO TABS: 20.0000 mg | ORAL_TABLET | Freq: Every day | ORAL | 3 refills | Status: DC

## 2020-06-03 NOTE — Patient Instructions (Addendum)
After Visit Summary:  We will be checking the following labs today - NONE   Medication Instructions:    Continue with your current medicines. BUT  We are staying off the Diltiazem for now  STOP the Naldolol  I am going to increase the Lisinopril to 20 mg a day - you can take 2 of the 10 mg tablets and use those up - the RX for the 20 mg has been sent to your pharmacy.    If you need a refill on your cardiac medications before your next appointment, please call your pharmacy.     Testing/Procedures To Be Arranged:   Follow-Up:   See Dr. Johnsie Cancel as planned in December.     At [facility], you and your health needs are our priority.  As part of our continuing mission to provide you with exceptional heart care, we have created designated Provider Care Teams.  These Care Teams include your primary Cardiologist (physician) and Advanced Practice Providers (APPs -  Physician Assistants and Nurse Practitioners) who all work together to provide you with the care you need, when you need it.  Special Instructions:  . Stay safe, wash your hands for at least 20 seconds and wear a mask when needed.  . It was good to talk with you today.  Marland Kitchen STAY OFF NALDOLOL . STAY OFF DILTIAZEM FOR NOW . SEND ME A MYCHART MESSAGE IN ABOUT A WEEK WITH AN UPDATE ON BLOOD PRESSURE AND HEART RATE . IF YOUR HEART RATE FAILS TO IMPROVE - WE WILL THEN PLACE A MONITOR   Call the [place] office at [phone] if you have any questions, problems or concerns.

## 2020-06-03 NOTE — Progress Notes (Addendum)
Patient ID: [name], female   DOB: [birth year], 74 y.o.   MRN: [id number]  14 day ZIO XT monitor cancelled.

## 2020-06-04 ENCOUNTER — Ambulatory Visit: Payer: Medicare Other | Admitting: Cardiovascular Disease

## 2020-06-10 DIAGNOSIS — R001 Bradycardia, unspecified: Secondary | ICD-10-CM

## 2020-06-10 DIAGNOSIS — I2510 Atherosclerotic heart disease of native coronary artery without angina pectoris: Secondary | ICD-10-CM

## 2020-06-10 MED ORDER — LISINOPRIL 20 MG PO TABS: 20.0000 mg | ORAL_TABLET | Freq: Two times a day (BID) | ORAL | 3 refills | Status: DC

## 2020-06-10 NOTE — Telephone Encounter (Signed)
S/w pt's daughter per (DPR) updated file with updated phone number. Is aware of increase in lisinopril one tablet by mouth ( 20 mg) twice daily, updated medication list. Will come for labs Oct 19 for repeat bmet.  Orders in and linked.  Appointment made. Does not need a refill at this time.  Will send bp readings and HR in couple weeks.

## 2020-06-24 ENCOUNTER — Other Ambulatory Visit: Payer: Medicare Other | Admitting: *Deleted

## 2020-06-24 ENCOUNTER — Other Ambulatory Visit: Payer: Self-pay

## 2020-06-24 DIAGNOSIS — I2510 Atherosclerotic heart disease of native coronary artery without angina pectoris: Secondary | ICD-10-CM

## 2020-06-24 DIAGNOSIS — R001 Bradycardia, unspecified: Secondary | ICD-10-CM

## 2020-06-25 ENCOUNTER — Other Ambulatory Visit: Payer: Self-pay | Admitting: *Deleted

## 2020-06-25 LAB — BASIC METABOLIC PANEL
BUN/Creatinine Ratio: 16 (ref 12–28)
BUN: 19 mg/dL (ref 8–27)
CO2: 24 mmol/L (ref 20–29)
Calcium: 9.9 mg/dL (ref 8.7–10.3)
Chloride: 102 mmol/L (ref 96–106)
Creatinine, Ser: 1.19 mg/dL — ABNORMAL HIGH (ref 0.57–1.00)
GFR calc Af Amer: 52 mL/min/{1.73_m2} — ABNORMAL LOW (ref >59)
GFR calc non Af Amer: 45 mL/min/{1.73_m2} — ABNORMAL LOW (ref >59)
Glucose: 215 mg/dL — ABNORMAL HIGH (ref 65–99)
Potassium: 5.5 mmol/L — ABNORMAL HIGH (ref 3.5–5.2)
Sodium: 139 mmol/L (ref 134–144)

## 2020-06-25 MED ORDER — LISINOPRIL 20 MG PO TABS: 10.0000 mg | ORAL_TABLET | Freq: Every day | ORAL | 3 refills | Status: AC

## 2020-06-25 MED ORDER — AMLODIPINE BESYLATE 5 MG PO TABS: 5.0000 mg | ORAL_TABLET | Freq: Every day | ORAL | 1 refills | Status: DC

## 2020-07-01 LAB — HEMOGLOBIN A1C: Hemoglobin A1C: 6.2

## 2020-08-05 NOTE — Progress Notes (Signed)
CARDIOLOGY OFFICE NOTE  Date:  08/08/2020    [name] Date of Birth: 01/10/1946 Medical Record #[id number]  PCP:  Verdell Carmine, MD  Cardiologist:  Johnsie Cancel  No chief complaint on file.   History of Present Illness:  74 y.o. with history of IDDM, CABG 2018 with LIMA to LAD, SVG to D1, SVG OM1. 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 Had ileocolectomy May 2018 with chemo for liver mets and now has lung mets TTE done 11/26/19 EF 60-65% no significant valve disease and LA only 3.4 cm She is getting palliative Rx with 5 FU/leucovorin and Avastin   Having more bad days than good. Lots of nausea BP low today Discussed holding norvasc if systolic BP < 465 mmHg   Past Medical History:  Diagnosis Date  . Arthritis   . Cancer [facility])    colon cancer  . Coronary artery disease   . Diabetes mellitus without complication ([place])   . GERD (gastroesophageal reflux disease)   . Heart murmur   . History of blood transfusion   . History of kidney stones   . Hyperlipidemia   . Hypertension   . Iron deficiency anemia due to chronic blood loss 09/17/2016  . 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 PONV (postoperative nausea and vomiting)    gets nauseated easily  . Previous back surgery   . S/P triple vessel bypass     Past Surgical History:  Procedure Laterality Date  . ABDOMINAL HYSTERECTOMY    . APPENDECTOMY    . BACK SURGERY    . CARDIAC CATHETERIZATION N/A 09/30/2016   Procedure: Left Heart Cath and Coronary Angiography;  Surgeon: Belva Crome, MD;  Location: [place] [facility];  Service: Cardiovascular;  Laterality: N/A;  . CHOLECYSTECTOMY    . COLONOSCOPY    . CORONARY ARTERY BYPASS GRAFT N/A 10/11/2016   Procedure: CORONARY ARTERY BYPASS GRAFTING (CABG) times three using left internal mammary artery and right leg saphenous vein;  Surgeon: Ivin Poot, MD;  Location: [place];  Service: Open Heart Surgery;  Laterality: N/A;  . TEE WITHOUT  CARDIOVERSION N/A 10/11/2016   Procedure: TRANSESOPHAGEAL ECHOCARDIOGRAM (TEE);  Surgeon: Ivin Poot, MD;  Location: [place];  Service: Open Heart Surgery;  Laterality: N/A;     Medications: Current Meds  Medication Sig  . acetaminophen (TYLENOL) 650 MG CR tablet Take 650 mg by mouth as needed.  . ALPRAZolam (XANAX) 0.5 MG tablet Take 0.5 mg by mouth 3 (three) times daily as needed.  Marland Kitchen aspirin EC 81 MG EC tablet Take 1 tablet (81 mg total) by mouth daily.  Marland Kitchen Besifloxacin HCl (BESIVANCE) 0.6 % SUSP Place 1 drop into the left eye 3 (three) times a day.  . Calcium Carbonate-Vitamin D (CALTRATE 600+D PO) Take 1 tablet by mouth daily.   . Continuous Blood Gluc Receiver (FREESTYLE LIBRE 2 READER) DEVI by Does not apply route.  . Continuous Blood Gluc Sensor (FREESTYLE LIBRE 14 DAY SENSOR) MISC SMARTSIG:1 Topical Every 2 Weeks  . Continuous Blood Gluc Sensor (FREESTYLE LIBRE 2 SENSOR) MISC by Does not apply route.  . dicyclomine (BENTYL) 10 MG capsule Take 10 mg by mouth 3 (three) times daily.  Marland Kitchen escitalopram (LEXAPRO) 10 MG tablet Take 1 tablet by mouth daily.  Marland Kitchen ezetimibe (ZETIA) 10 MG tablet Take 10 mg by mouth daily.  . Insulin Human (INSULIN PUMP) SOLN Inject 1.5-5.5 each into the skin 3 (three) times daily.  humalog  . insulin lispro (HUMALOG) 100 UNIT/ML injection MEDTRONICS 630G.  BASAL 0.6.  I:C 15.  ISF 40.  Total: 40 UNITS/DAY  . lidocaine-prilocaine (EMLA) cream APPLY TOPICALLY TO PORT SITE ONE HOUR PRIOR TO EACH TREATMENT AS NEEDED  . lisinopril (ZESTRIL) 20 MG tablet Take 0.5 tablets (10 mg total) by mouth daily.  . magnesium oxide (MAG-OX) 400 MG tablet Take 400 mg by mouth daily.  . metFORMIN (GLUCOPHAGE-XR) 500 MG 24 hr tablet 500 mg BID  . omeprazole (PRILOSEC) 40 MG capsule Take 1 capsule by mouth daily.  . ondansetron (ZOFRAN) 4 MG tablet TAKE 1 TABLET BY MOUTH EVERY 8 HOURS AS NEEDED FOR NAUSEA FOR UP TO 5 DAYS  . Pediatric Multivitamins-Iron (FLINTSTONES COMPLETE) 18 MG CHEW  Chew 1 tablet by mouth daily.  . Prodigy Lancets 28G MISC USE 1  TO CHECK GLUCOSE 4 TIMES DAILY  . promethazine (PHENERGAN) 12.5 MG tablet Take by mouth.  . urea (CARMOL) 10 % cream Apply topically.  . [DISCONTINUED] amLODipine (NORVASC) 5 MG tablet Take 1 tablet (5 mg total) by mouth daily.     Allergies: Allergies  Allergen Reactions  . Hydrochlorothiazide Other (See Comments)    Other reaction(s): Other HYPERCALCEMIA HYPERCALCEMIA  . Losartan Potassium     Other reaction(s): Other Increased eosinophils    Social History: The patient  reports that she has never smoked. She has never used smokeless tobacco. She reports that she does not drink alcohol and does not use drugs.   Family History: The patient's family history includes Diabetes in her mother; Heart attack in her father and mother.   Review of Systems: Please see the history of present illness.   All other systems are reviewed and negative.   Physical Exam: VS:  BP 100/62   Pulse 82   Ht 5\' 4"  (1.626 m)   Wt 54.9 kg   SpO2 98%   BMI 20.77 kg/m  .  BMI Body mass index is 20.77 kg/m.  Wt Readings from Last 3 Encounters:  08/08/20 54.9 kg  06/03/20 60.1 kg  12/26/19 64.4 kg   Affect appropriate Chronically ill cancer patient  HEENT: normal Neck supple with no adenopathy JVP normal no bruits no thyromegaly Lungs clear with no wheezing and good diaphragmatic motion Heart:  S1/S2 no murmur, no rub, gallop or click PMI normal Tunneled catheter under right clavicle  Abdomen: benighn, BS positve, no tenderness, no AAA no bruit.  No HSM or HJR Distal pulses intact with no bruits No edema Neuro non-focal Skin warm and dry No muscular weakness   LABORATORY DATA:  EKG:  EKG is ordered today.  Personally reviewed by me. This demonstrates sinus bradycardia with short PR.   Lab Results  Component Value Date   WBC 5.6 12/31/2016   HGB 10.1 (L) 12/31/2016   HCT 31.3 (L) 12/31/2016   PLT 274 12/31/2016    GLUCOSE 215 (H) 06/24/2020   CHOL 147 09/12/2017   TRIG 212 (H) 09/12/2017   HDL 56 09/12/2017   LDLCALC 63 09/12/2017   ALT 49 (H) 09/12/2017   AST 43 (H) 09/12/2017   NA 139 06/24/2020   K 5.5 (H) 06/24/2020   CL 102 06/24/2020   CREATININE 1.19 (H) 06/24/2020   BUN 19 06/24/2020   CO2 24 06/24/2020   INR 1.30 10/11/2016   HGBA1C 6.4 09/25/2019     BNP (last 3 results) No results for input(s): BNP in the last 8760 hours.  ProBNP (last 3 results) No  results for input(s): PROBNP in the last 8760 hours.   Other Studies Reviewed Today:  ECHO IMPRESSIONS 11/2019  1. Left ventricular ejection fraction, by estimation, is 60 to 65%. The  left ventricle has normal function. The left ventricle has no regional  wall motion abnormalities. Left ventricular diastolic parameters are  consistent with Grade I diastolic  dysfunction (impaired relaxation).  2. Right ventricular systolic function is moderately reduced. The right  ventricular size is normal. There is mildly elevated pulmonary artery  systolic pressure.  3. The mitral valve is normal in structure. No evidence of mitral valve  regurgitation. No evidence of mitral stenosis.  4. The aortic valve is normal in structure. Aortic valve regurgitation is  not visualized. No aortic stenosis is present.    ASSESSMENT AND PLAN:  1. Bradycardia - resolved off beta blocker   2. HTN - low due to cancer 1/2 dose ACE and holding norvasc if needed   3. Remote PAF - NSR now observe    4. CAD with prior CABG - no angina continue medical Rx   5. 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  The patient does not have concerns regarding medicines other than what has been noted above.  The following changes have been made:  See above.  Labs/ tests ordered today include:    No orders of the defined types were placed  in this encounter.    Disposition:   FU with me in  6 months    Patient is agreeable to this plan and will call if any problems develop in the interim.   Signed: Jenkins Rouge, MD  08/08/2020 9:11 AM  [facility] [address] [place],   [address] Phone: [phone] Fax: [phone]

## 2020-08-08 ENCOUNTER — Other Ambulatory Visit: Payer: Self-pay

## 2020-08-08 ENCOUNTER — Encounter: Payer: Self-pay | Admitting: Cardiovascular Disease

## 2020-08-08 ENCOUNTER — Ambulatory Visit: Payer: Medicare Other | Admitting: Cardiovascular Disease

## 2020-08-08 VITALS — BP 100/62 | HR 82 | Ht 64.0 in | Wt 121.0 lb

## 2020-08-08 DIAGNOSIS — R001 Bradycardia, unspecified: Secondary | ICD-10-CM

## 2020-08-08 DIAGNOSIS — Z951 Presence of aortocoronary bypass graft: Secondary | ICD-10-CM

## 2020-08-08 NOTE — Patient Instructions (Addendum)
Medication Instructions:  Your physician has recommended you make the following change in your medication:  1-STOP Amlodipine (Norvasc)  *If you need a refill on your cardiac medications before your next appointment, please call your pharmacy*  Lab Work: If you have labs (blood work) drawn today and your tests are completely normal, you will receive your results only by: Marland Kitchen MyChart Message (if you have MyChart) OR . A paper copy in the mail If you have any lab test that is abnormal or we need to change your treatment, we will call you to review the results.  Testing/Procedures: None ordered today.   Follow-Up: At [facility], you and your health needs are our priority.  As part of our continuing mission to provide you with exceptional heart care, we have created designated Provider Care Teams.  These Care Teams include your primary Cardiologist (physician) and Advanced Practice Providers (APPs -  Physician Assistants and Nurse Practitioners) who all work together to provide you with the care you need, when you need it.  We recommend signing up for the patient portal called "MyChart".  Sign up information is provided on this After Visit Summary.  MyChart is used to connect with patients for Virtual Visits (Telemedicine).  Patients are able to view lab/test results, encounter notes, upcoming appointments, etc.  Non-urgent messages can be sent to your provider as well.   To learn more about what you can do with MyChart, go to [email or web address].    Your next appointment:   5 -6  month(s)  The format for your next appointment:   In Person  Provider:   You may see Dr. Johnsie Cancel or one of the following Advanced Practice Providers on your designated Care Team:    Truitt Merle, NP  Cecilie Kicks, NP  Kathyrn Drown, NP

## 2020-11-04 DEATH — deceased

## 2021-01-20 NOTE — Progress Notes (Deleted)
CARDIOLOGY OFFICE NOTE  Date:  01/20/2021    Tera Mater Date of Birth: 07/22/1946 Medical Record #[id number]  PCP:  Verdell Carmine, MD  Cardiologist:  Johnsie Cancel  No chief complaint on file.   History of Present Illness:  75 y.o. with IDDM, PAF not on anticoagulation due to metastatic colon cancer getting palliative Rx with mets to lungs and liver. History of CABG 2018 [place] to LAD, SVG D1 SVG OM1 EF normal by TTE 11/26/19 Norvasc d/c due to low BP   ***  Past Medical History:  Diagnosis Date  . Arthritis   . Cancer [facility])    colon cancer  . Coronary artery disease   . Diabetes mellitus without complication ([place])   . GERD (gastroesophageal reflux disease)   . Heart murmur   . History of blood transfusion   . History of kidney stones   . Hyperlipidemia   . Hypertension   . Iron deficiency anemia due to chronic blood loss 09/17/2016  . 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 PONV (postoperative nausea and vomiting)    gets nauseated easily  . Previous back surgery   . S/P triple vessel bypass     Past Surgical History:  Procedure Laterality Date  . ABDOMINAL HYSTERECTOMY    . APPENDECTOMY    . BACK SURGERY    . CARDIAC CATHETERIZATION N/A 09/30/2016   Procedure: Left Heart Cath and Coronary Angiography;  Surgeon: Belva Crome, MD;  Location: [place] [facility];  Service: Cardiovascular;  Laterality: N/A;  . CHOLECYSTECTOMY    . COLONOSCOPY    . CORONARY ARTERY BYPASS GRAFT N/A 10/11/2016   Procedure: CORONARY ARTERY BYPASS GRAFTING (CABG) times three using left internal mammary artery and right leg saphenous vein;  Surgeon: Ivin Poot, MD;  Location: [place];  Service: Open Heart Surgery;  Laterality: N/A;  . TEE WITHOUT CARDIOVERSION N/A 10/11/2016   Procedure: TRANSESOPHAGEAL ECHOCARDIOGRAM (TEE);  Surgeon: Ivin Poot, MD;  Location: [place];  Service: Open Heart Surgery;  Laterality: N/A;     Medications: No outpatient medications have been marked as taking for  the 01/26/21 encounter (Appointment) with Josue Hector, MD.     Allergies: Allergies  Allergen Reactions  . Hydrochlorothiazide Other (See Comments)    Other reaction(s): Other HYPERCALCEMIA HYPERCALCEMIA  . Losartan Potassium     Other reaction(s): Other Increased eosinophils    Social History: The patient  reports that she has never smoked. She has never used smokeless tobacco. She reports that she does not drink alcohol and does not use drugs.   Family History: The patient's family history includes Diabetes in her mother; Heart attack in her father and mother.   Review of Systems: Please see the history of present illness.   All other systems are reviewed and negative.   Physical Exam: VS:  There were no vitals taken for this visit. Marland Kitchen  BMI There is no height or weight on file to calculate BMI.  Wt Readings from Last 3 Encounters:  08/08/20 54.9 kg  06/03/20 60.1 kg  12/26/19 64.4 kg   Affect appropriate Chronically ill cancer patient  HEENT: normal Neck supple with no adenopathy JVP normal no bruits no thyromegaly Lungs clear with no wheezing and good diaphragmatic motion Heart:  S1/S2 no murmur, no rub, gallop or click PMI normal Tunneled catheter under right clavicle  Abdomen: benighn, BS positve, no tenderness, no AAA no bruit.  No HSM or HJR Distal pulses intact with no bruits  No edema Neuro non-focal Skin warm and dry No muscular weakness   LABORATORY DATA:  EKG:  EKG is ordered today.  Personally reviewed by me. This demonstrates sinus bradycardia with short PR.   Lab Results  Component Value Date   WBC 5.6 12/31/2016   HGB 10.1 (L) 12/31/2016   HCT 31.3 (L) 12/31/2016   PLT 274 12/31/2016   GLUCOSE 215 (H) 06/24/2020   CHOL 147 09/12/2017   TRIG 212 (H) 09/12/2017   HDL 56 09/12/2017   LDLCALC 63 09/12/2017   ALT 49 (H) 09/12/2017   AST 43 (H) 09/12/2017   NA 139 06/24/2020   K 5.5 (H) 06/24/2020   CL 102 06/24/2020   CREATININE 1.19  (H) 06/24/2020   BUN 19 06/24/2020   CO2 24 06/24/2020   INR 1.30 10/11/2016   HGBA1C 6.2 07/01/2020     BNP (last 3 results) No results for input(s): BNP in the last 8760 hours.  ProBNP (last 3 results) No results for input(s): PROBNP in the last 8760 hours.   Other Studies Reviewed Today:  ECHO IMPRESSIONS 11/2019  1. Left ventricular ejection fraction, by estimation, is 60 to 65%. The  left ventricle has normal function. The left ventricle has no regional  wall motion abnormalities. Left ventricular diastolic parameters are  consistent with Grade I diastolic  dysfunction (impaired relaxation).  2. Right ventricular systolic function is moderately reduced. The right  ventricular size is normal. There is mildly elevated pulmonary artery  systolic pressure.  3. The mitral valve is normal in structure. No evidence of mitral valve  regurgitation. No evidence of mitral stenosis.  4. The aortic valve is normal in structure. Aortic valve regurgitation is  not visualized. No aortic stenosis is present.    ASSESSMENT AND PLAN:  1. Bradycardia - improved off beta blockers   2. HTN - low related to cancer meds adjusted down   3. Remote PAF - maintaining NSR   4. CAD with prior CABG - angina free continue medical Rx    5. 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  The patient does not have concerns regarding medicines other than what has been noted above.  The following changes have been made:  See above.  Labs/ tests ordered today include:    No orders of the defined types were placed in this encounter.    Disposition:   FU with me in  6 months    Patient is agreeable to this plan and will call if any problems develop in the interim.   Signed: Jenkins Rouge, MD  01/20/2021 8:38 AM  [facility] [address] [place], [place]   [address] Phone: [phone] Fax: [phone]

## 2021-01-26 ENCOUNTER — Ambulatory Visit: Payer: Medicare Other | Admitting: Cardiovascular Disease
